# Patient Record
Sex: Male | Born: 1985 | Race: Black or African American | Hispanic: No | Marital: Married | Smoking: Former smoker
Health system: Southern US, Community
[De-identification: ages and names within clinical notes are randomized; demographics above are authoritative.]

## PROBLEM LIST (undated history)

## (undated) DIAGNOSIS — K219 Gastro-esophageal reflux disease without esophagitis: Secondary | ICD-10-CM

## (undated) DIAGNOSIS — F419 Anxiety disorder, unspecified: Secondary | ICD-10-CM

## (undated) DIAGNOSIS — N189 Chronic kidney disease, unspecified: Secondary | ICD-10-CM

## (undated) DIAGNOSIS — I10 Essential (primary) hypertension: Secondary | ICD-10-CM

## (undated) HISTORY — DX: Chronic kidney disease, unspecified: N18.9

## (undated) HISTORY — PX: WISDOM TOOTH EXTRACTION: SHX21

## (undated) HISTORY — DX: Essential (primary) hypertension: I10

## (undated) HISTORY — PX: TONSILLECTOMY AND ADENOIDECTOMY: SUR1326

## (undated) HISTORY — DX: Anxiety disorder, unspecified: F41.9

---
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
## 2011-11-27 ENCOUNTER — Other Ambulatory Visit: Payer: Self-pay | Admitting: Physician Assistant

## 2011-11-27 ENCOUNTER — Ambulatory Visit (INDEPENDENT_AMBULATORY_CARE_PROVIDER_SITE_OTHER): Payer: BC Managed Care – PPO | Admitting: Physician Assistant

## 2011-11-27 ENCOUNTER — Encounter: Payer: Self-pay | Admitting: Physician Assistant

## 2011-11-27 VITALS — BP 129/73 | HR 55 | Ht 75.0 in | Wt 258.0 lb

## 2011-11-27 DIAGNOSIS — F411 Generalized anxiety disorder: Secondary | ICD-10-CM

## 2011-11-27 DIAGNOSIS — F419 Anxiety disorder, unspecified: Secondary | ICD-10-CM

## 2011-11-27 DIAGNOSIS — Z13220 Encounter for screening for lipoid disorders: Secondary | ICD-10-CM

## 2011-11-27 DIAGNOSIS — F329 Major depressive disorder, single episode, unspecified: Secondary | ICD-10-CM

## 2011-11-27 DIAGNOSIS — F3289 Other specified depressive episodes: Secondary | ICD-10-CM

## 2011-11-27 DIAGNOSIS — Z7689 Persons encountering health services in other specified circumstances: Secondary | ICD-10-CM

## 2011-11-27 DIAGNOSIS — Z131 Encounter for screening for diabetes mellitus: Secondary | ICD-10-CM

## 2011-11-27 DIAGNOSIS — R0789 Other chest pain: Secondary | ICD-10-CM

## 2011-11-27 DIAGNOSIS — Z113 Encounter for screening for infections with a predominantly sexual mode of transmission: Secondary | ICD-10-CM

## 2011-11-27 LAB — COMPLETE METABOLIC PANEL WITHOUT GFR
ALT: 40 U/L (ref 0–53)
AST: 27 U/L (ref 0–37)
Albumin: 5.2 g/dL (ref 3.5–5.2)
Alkaline Phosphatase: 49 U/L (ref 39–117)
BUN: 13 mg/dL (ref 6–23)
CO2: 28 meq/L (ref 19–32)
Calcium: 10.3 mg/dL (ref 8.4–10.5)
Chloride: 105 meq/L (ref 96–112)
Creat: 0.9 mg/dL (ref 0.50–1.35)
GFR, Est African American: >89 mL/min
GFR, Est Non African American: >89 mL/min
Glucose, Bld: 94 mg/dL (ref 70–99)
Potassium: 4.8 meq/L (ref 3.5–5.3)
Sodium: 140 meq/L (ref 135–145)
Total Bilirubin: 0.6 mg/dL (ref 0.3–1.2)
Total Protein: 6.9 g/dL (ref 6.0–8.3)

## 2011-11-27 LAB — LIPID PANEL
Cholesterol: 171 mg/dL (ref 0–200)
HDL: 33 mg/dL — ABNORMAL LOW
LDL Cholesterol: 126 mg/dL — ABNORMAL HIGH (ref 0–99)
Total CHOL/HDL Ratio: 5.2 ratio
Triglycerides: 58 mg/dL
VLDL: 12 mg/dL (ref 0–40)

## 2011-11-27 LAB — HIV ANTIBODY (ROUTINE TESTING W REFLEX): HIV: NONREACTIVE

## 2011-11-27 MED ORDER — BUSPIRONE HCL 7.5 MG PO TABS: 7.5000 mg | ORAL_TABLET | Freq: Two times a day (BID) | ORAL | Status: AC

## 2011-11-27 MED ORDER — DEXLANSOPRAZOLE 60 MG PO CPDR: 60.0000 mg | DELAYED_RELEASE_CAPSULE | Freq: Every day | ORAL | Status: DC

## 2011-11-27 NOTE — Patient Instructions (Addendum)
Start Buspar twice a day. Start exercising 4-5 times a week for 30 minutes to and hour. Check out Gardisil shot and see if insurance will pay. Will refer for stress test and psychologist. Will call with labs in next 2-3 days. Start Dexilant daily for acid reflux and consider diet changes. Recheck in 5-8 weeks.  Diet for GERD or PUD Nutrition therapy can help ease the discomfort of gastroesophageal reflux disease (GERD) and peptic ulcer disease (PUD).  HOME CARE INSTRUCTIONS   Eat your meals slowly, in a relaxed setting.   Eat 5 to 6 small meals per day.   If a food causes distress, stop eating it for a period of time.  FOODS TO AVOID  Coffee, regular or decaffeinated.   Cola beverages, regular or low calorie.   Tea, regular or decaffeinated.   Pepper.   Cocoa.   High fat foods, including meats.   Butter, margarine, hydrogenated oil (trans fats).   Peppermint or spearmint (if you have GERD).   Fruits and vegetables if not tolerated.   Alcohol.   Nicotine (smoking or chewing). This is one of the most potent stimulants to acid production in the gastrointestinal tract.   Any food that seems to aggravate your condition.  If you have questions regarding your diet, ask your caregiver or a registered dietitian. TIPS  Lying flat may make symptoms worse. Keep the head of your bed raised 6 to 9 inches (15 to 23 cm) by using a foam wedge or blocks under the legs of the bed.   Do not lay down until 3 hours after eating a meal.   Daily physical activity may help reduce symptoms.  MAKE SURE YOU:   Understand these instructions.   Will watch your condition.   Will get help right away if you are not doing well or get worse.  Document Released: 07/24/2005 Document Revised: 07/13/2011 Document Reviewed: 06/09/2011 [facility] Patient Information 2012 [place], [place].

## 2011-11-27 NOTE — Progress Notes (Signed)
  Subjective:    Patient ID: Gene Nielsen, male    DOB: 11-12-1985, 26 y.o.   MRN: [id number]  HPI Patient presents to the clinic to establish care. PMH reviewed.  Patient wants STD testing today. He has not had the Gardisil vaccine.  He has been having chest pain and tightness. He went to ER last week and was checked out. EKG and all blood work was normal. He has always had a problem with anxiety and depression. He was treated with Prozac before but did not like sexual side effects. He is not currently on any medication. He does not exercise. He denies any bad taste in his mouth or heartburn. He denies any SOB or wheezing.     Review of Systems     Objective:   Physical Exam  Constitutional: He is oriented to person, place, and time. He appears well-developed and well-nourished.  HENT:  Head: Normocephalic and atraumatic.  Cardiovascular: Normal rate, regular rhythm and normal heart sounds.   Pulmonary/Chest: Effort normal and breath sounds normal. He has no wheezes.  Neurological: He is alert and oriented to person, place, and time.  Skin: Skin is warm and dry.  Psychiatric: He has a normal mood and affect. His behavior is normal.          Assessment & Plan:  Anxiety and Depression-GAD-7 was 19. PHQ-9 was 10. Patient did not want to go on SSRI because of sexual side effects and had tried Wellbutrin and didn't work. Started on Buspar 7.5mg  BID. Encouraged regular exercise 4-5 times a week for at least 30 minutes. Patient was told the exercise is the best anti-anxiety/depressant.I think patient would benefit talking to someone about anxiety and situation issues. Will refer to psychologist.  Atypical chest pains-Will refer for stress test and psychologist. Will call with labs in next 2-3 days. Start Dexilant daily for acid reflux and consider diet changes. Recheck in 5-8 weeks.  STD testing wanted by patient.  Vaccine counseling- Patient will check with insurance to see if  they will pay for Gardisil.

## 2011-11-28 NOTE — Progress Notes (Signed)
Quick Note:  Call patient with results. Let them know all labs are within normal limits. ______ 

## 2011-11-29 DIAGNOSIS — F419 Anxiety disorder, unspecified: Secondary | ICD-10-CM | POA: Insufficient documentation

## 2011-11-29 DIAGNOSIS — F32A Depression, unspecified: Secondary | ICD-10-CM | POA: Insufficient documentation

## 2011-11-29 DIAGNOSIS — F329 Major depressive disorder, single episode, unspecified: Secondary | ICD-10-CM | POA: Insufficient documentation

## 2014-07-23 ENCOUNTER — Ambulatory Visit (INDEPENDENT_AMBULATORY_CARE_PROVIDER_SITE_OTHER): Payer: BC Managed Care – PPO | Admitting: Medical

## 2014-07-23 ENCOUNTER — Encounter: Payer: Self-pay | Admitting: Medical

## 2014-07-23 VITALS — BP 142/84 | HR 77 | Temp 98.2°F | Ht 75.0 in | Wt 287.6 lb

## 2014-07-23 DIAGNOSIS — F329 Major depressive disorder, single episode, unspecified: Secondary | ICD-10-CM

## 2014-07-23 DIAGNOSIS — F32A Depression, unspecified: Secondary | ICD-10-CM

## 2014-07-23 DIAGNOSIS — J209 Acute bronchitis, unspecified: Secondary | ICD-10-CM

## 2014-07-23 DIAGNOSIS — I10 Essential (primary) hypertension: Secondary | ICD-10-CM

## 2014-07-23 DIAGNOSIS — F419 Anxiety disorder, unspecified: Secondary | ICD-10-CM

## 2014-07-23 LAB — COMPREHENSIVE METABOLIC PANEL
ALBUMIN: 4.8 g/dL (ref 3.5–5.2)
ALK PHOS: 45 U/L (ref 39–117)
ALT: 50 U/L (ref 0–53)
AST: 32 U/L (ref 0–37)
BUN: 21 mg/dL (ref 6–23)
CO2: 23 mEq/L (ref 19–32)
CREATININE: 1 mg/dL (ref 0.4–1.5)
Calcium: 9.6 mg/dL (ref 8.4–10.5)
Chloride: 109 mEq/L (ref 96–112)
GFR: 112.68 mL/min (ref >60.00)
GLUCOSE: 91 mg/dL (ref 70–99)
POTASSIUM: 4.1 meq/L (ref 3.5–5.1)
Sodium: 142 mEq/L (ref 135–145)
Total Bilirubin: 0.5 mg/dL (ref 0.2–1.2)
Total Protein: 7.3 g/dL (ref 6.0–8.3)

## 2014-07-23 LAB — CBC WITH DIFFERENTIAL/PLATELET
BASOS PCT: 0.5 % (ref 0.0–3.0)
Basophils Absolute: 0 10*3/uL (ref 0.0–0.1)
EOS PCT: 1.7 % (ref 0.0–5.0)
Eosinophils Absolute: 0.1 10*3/uL (ref 0.0–0.7)
HEMATOCRIT: 46.2 % (ref 39.0–52.0)
HEMOGLOBIN: 15.5 g/dL (ref 13.0–17.0)
LYMPHS ABS: 2.6 10*3/uL (ref 0.7–4.0)
Lymphocytes Relative: 43.4 % (ref 12.0–46.0)
MCHC: 33.5 g/dL (ref 30.0–36.0)
MCV: 89.7 fl (ref 78.0–100.0)
Monocytes Absolute: 0.3 10*3/uL (ref 0.1–1.0)
Monocytes Relative: 5.5 % (ref 3.0–12.0)
NEUTROS PCT: 48.9 % (ref 43.0–77.0)
Neutro Abs: 2.9 10*3/uL (ref 1.4–7.7)
Platelets: 248 10*3/uL (ref 150.0–400.0)
RBC: 5.16 Mil/uL (ref 4.22–5.81)
RDW: 13.5 % (ref 11.5–15.5)
WBC: 6 10*3/uL (ref 4.0–10.5)

## 2014-07-23 MED ORDER — LISINOPRIL 5 MG PO TABS: 5.0000 mg | ORAL_TABLET | Freq: Every day | ORAL | Status: DC

## 2014-07-23 MED ORDER — AZITHROMYCIN 250 MG PO TABS: ORAL_TABLET | ORAL | Status: DC

## 2014-07-23 MED ORDER — BENZONATATE 100 MG PO CAPS: 100.0000 mg | ORAL_CAPSULE | Freq: Three times a day (TID) | ORAL | Status: DC | PRN

## 2014-07-23 MED ORDER — FLUTICASONE PROPIONATE 50 MCG/ACT NA SUSP: 2.0000 | Freq: Every day | NASAL | Status: DC

## 2014-07-23 NOTE — Progress Notes (Signed)
Subjective:    Patient ID: Gene Nielsen, male    DOB: 1986-05-07, 28 y.o.   MRN: [id number]  HPI   I have reviewed pt PMH, PSH, FH, Social History and Surgical History Pt works glazier(glass work), works out [employer]weights, no cardio, 1 pepsi a day, married- no children. [employer] grade.  Anxiety- Pt states when younger had but now controlled.   Htn- hx of high bp. Dx 28 yo. Pt states stopped medication 7-8 yr ago. He only has one kidney. He stopped medication 7 yr ago.  Has one kidney- He states one just atrophied. He is not sure which kidney he has.  Pt smokes 7 black and mild a day.  Pt check his bp at home and 140/79 on average. Occasional diastolic of 90. He checks one time a day. No cardiac or neurologic signs or symptoms.  Pt also states chronic cough past 3 weeks. Productive cough and sinus pressure. No fever, no chills. Mild fatigue. Some congestion and rt ear pain.    Past Medical History  Diagnosis Date  . Chronic kidney disease   . Hypertension   . Anxiety     History   Social History  . Marital Status: Single    Spouse Name: N/A    Number of Children: N/A  . Years of Education: N/A   Occupational History  . Not on file.   Social History Main Topics  . Smoking status: Current Every Day Smoker    Last Attempt to Quit: 11/18/2011  . Smokeless tobacco: Not on file  . Alcohol Use: 0.0 oz/week    0 Not specified per week  . Drug Use: Not on file  . Sexual Activity:    Partners: Female   Other Topics Concern  . Not on file   Social History Narrative    Past Surgical History  Procedure Laterality Date  . Tonsillectomy and adenoidectomy    . Wisdom tooth extraction      Family History  Problem Relation Age of Onset  . Depression Mother   . Hypertension Mother     Allergies  Allergen Reactions  . Penicillins Swelling    No current outpatient prescriptions on file prior to visit.   No current facility-administered medications on file prior to  visit.    BP 142/84 mmHg  Pulse 77  Temp(Src) 98.2 F (36.8 C) (Oral)  Ht 6\' 3"  (1.905 m)  Wt 287 lb 9.6 oz (130.455 kg)  BMI 35.95 kg/m2  SpO2 95%           Review of Systems  Constitutional: Positive for fatigue. Negative for fever and chills.  HENT: Positive for sinus pressure. Negative for congestion, postnasal drip and sore throat.        Nasal congestion   Respiratory: Positive for cough. Negative for wheezing.        Mild chest congestion.  Cardiovascular: Negative for chest pain and palpitations.  Musculoskeletal: Negative for back pain and neck pain.  Neurological: Positive for tremors. Negative for dizziness, seizures, syncope, facial asymmetry, speech difficulty, weakness, light-headedness, numbness and headaches.  Hematological: Negative for adenopathy. Does not bruise/bleed easily.       Objective:   Physical Exam   General  Mental Status - Alert. General Appearance - Well groomed. Not in acute distress.  Skin Rashes- No Rashes.  HEENT Head- Normal. Ear Auditory Canal - Left- Normal. Right - Normal.Tympanic Membrane- Left- red Right- Normal. Eye Sclera/Conjunctiva- Left- Normal. Right- Normal. Nose & Sinuses Nasal Mucosa-  Left-  Boggy and Congested. Right-  Boggy and  Congested.Bilateral maxillary and frontal sinus pressure. Mouth & Throat Lips: Upper Lip- Normal: no dryness, cracking, pallor, cyanosis, or vesicular eruption. Lower Lip-Normal: no dryness, cracking, pallor, cyanosis or vesicular eruption. Buccal Mucosa- Bilateral- No Aphthous ulcers. Oropharynx- No Discharge or Erythema. Tonsils: Characteristics- Bilateral- No Erythema or Congestion. Size/Enlargement- Bilateral- No enlargement. Discharge- bilateral-None.  Neck Neck- Supple. No Masses.   Chest and Lung Exam Auscultation: Breath Sounds:-Clear even and unlabored. faint upper lobe rhonchi.  Cardiovascular Auscultation:Rythm- Regular, rate and rhythm. Murmurs & Other Heart  Sounds:Ausculatation of the heart reveal- No Murmurs.  Lymphatic Head & Neck General Head & Neck Lymphatics: Bilateral: Description- No Localized lymphadenopathy.   Neurologic Cranial Nerve exam:- CN III-XII intact(No nystagmus), symmetric smile. Drift Test:- No drift. Romberg Exam:- Negative.  Heal to Toe Gait exam:-Normal. Finger to Nose:- Normal/Intact Strength:- 5/5 equal and symmetric strength both upper and lower extremities.         Assessment & Plan:

## 2014-07-23 NOTE — Assessment & Plan Note (Signed)
For your hypertension I will put you on lisinopril and get labs. With hx of one kidney stressed need for bp control. Pt expressed understanding.

## 2014-07-23 NOTE — Assessment & Plan Note (Signed)
Your appear to have a bronchtis with sinusitis.  I am prescribing azithromycin  antibiotic for the infection. To help with the nasal congestion I prescribed nasal steroid. For your associated cough, I prescribed cough medicine benzonatate

## 2014-07-23 NOTE — Assessment & Plan Note (Signed)
None reported today.

## 2014-07-23 NOTE — Progress Notes (Signed)
Pre visit review using our clinic review tool, if applicable. No additional management support is needed unless otherwise documented below in the visit note. 

## 2014-07-23 NOTE — Patient Instructions (Addendum)
Your appear to have a bronchtis with sinusitis.  I am prescribing azithromycin  antibiotic for the infection. To help with the nasal congestion I prescribed nasal steroid. For your associated cough, I prescribed cough medicine benzonatate   For your hypertension I will put you on lisinopril and get labs today.     Rest, hydrate, tylenol for fever.  Follow up in 7 days or as needed.

## 2014-07-23 NOTE — Assessment & Plan Note (Signed)
Controlled.  

## 2014-07-24 ENCOUNTER — Telehealth: Payer: Self-pay | Admitting: Medical

## 2014-07-24 NOTE — Telephone Encounter (Signed)
emmi mailed  °

## 2014-08-04 ENCOUNTER — Ambulatory Visit: Payer: BC Managed Care – PPO | Admitting: Medical

## 2017-08-21 ENCOUNTER — Ambulatory Visit: Payer: BLUE CROSS/BLUE SHIELD | Admitting: Podiatry

## 2018-03-05 DIAGNOSIS — F330 Major depressive disorder, recurrent, mild: Secondary | ICD-10-CM | POA: Insufficient documentation

## 2018-04-18 DIAGNOSIS — F410 Panic disorder [episodic paroxysmal anxiety] without agoraphobia: Secondary | ICD-10-CM | POA: Insufficient documentation

## 2018-09-04 ENCOUNTER — Ambulatory Visit: Payer: Self-pay | Admitting: Family Medicine

## 2018-09-04 DIAGNOSIS — Z0289 Encounter for other administrative examinations: Secondary | ICD-10-CM

## 2019-01-30 ENCOUNTER — Telehealth (INDEPENDENT_AMBULATORY_CARE_PROVIDER_SITE_OTHER): Payer: Managed Care, Other (non HMO) | Admitting: Family Medicine

## 2019-01-30 ENCOUNTER — Encounter: Payer: Self-pay | Admitting: Family Medicine

## 2019-01-30 DIAGNOSIS — K219 Gastro-esophageal reflux disease without esophagitis: Secondary | ICD-10-CM

## 2019-01-30 DIAGNOSIS — I10 Essential (primary) hypertension: Secondary | ICD-10-CM | POA: Diagnosis not present

## 2019-01-30 HISTORY — DX: Gastro-esophageal reflux disease without esophagitis: K21.9

## 2019-01-30 MED ORDER — PANTOPRAZOLE SODIUM 40 MG PO TBEC: 40.0000 mg | DELAYED_RELEASE_TABLET | Freq: Every day | ORAL | 0 refills | Status: DC

## 2019-01-30 NOTE — Progress Notes (Signed)
Gene Nielsen - 33 y.o. male MRN [id number]  Date of birth: 1985-10-14   This visit type was conducted due to national recommendations for restrictions regarding the COVID-19 Pandemic (e.g. social distancing).  This format is felt to be most appropriate for this patient at this time.  All issues noted in this document were discussed and addressed.  No physical exam was performed (except for noted visual exam findings with Video Visits).  I discussed the limitations of evaluation and management by telemedicine and the availability of in person appointments. The patient expressed understanding and agreed to proceed.  I connected with@ on 01/30/19 at  9:30 AM EDT by a video enabled telemedicine application and verified that I am speaking with the correct person using two identifiers.   Patient Location: [place] [address]   Provider location:   Home office  Chief Complaint  Patient presents with  . Establish Care    Np requesting Labs,- Pre-screened COVID heartburn/indigestion/HTN/Kidneys - One remaining,    HPI  Gene Nielsen is a 33 y.o. male who presents via Engineer, civil (consulting) for a telehealth visit today.  He b reports a history of HTN and solitary kidney.  States that he changed his diet and blood pressure is much better controlled off medication but has started having symptoms of reflux.  Symptoms typically occur in the evenings or early morning.  He reports pain in his stomach and chest.  Feels like tightness at times in his chest.  He denies shortness of breath, worsening with exertion, dark stools, nausea or vomiting.  He is taking pepcid with some improvement.  He denies NSAID use.     ROS:  A comprehensive ROS was completed and negative except as noted per HPI  Past Medical History:  Diagnosis Date  . Anxiety   . Chronic kidney disease   . Hypertension     Past Surgical History:  Procedure Laterality Date  . TONSILLECTOMY AND  ADENOIDECTOMY    . WISDOM TOOTH EXTRACTION      Family History  Problem Relation Age of Onset  . Depression Mother   . Hypertension Mother     Social History   Socioeconomic History  . Marital status: Single    Spouse name: Not on file  . Number of children: Not on file  . Years of education: Not on file  . Highest education level: Not on file  Occupational History  . Not on file  Social Needs  . Financial resource strain: Not on file  . Food insecurity    Worry: Not on file    Inability: Not on file  . Transportation needs    Medical: Not on file    Non-medical: Not on file  Tobacco Use  . Smoking status: Current Every Day Smoker    Last attempt to quit: 11/18/2011    Years since quitting: 7.2  . Smokeless tobacco: Never Used  Substance and Sexual Activity  . Alcohol use: Yes    Alcohol/week: 0.0 standard drinks  . Drug use: Never  . Sexual activity: Yes    Partners: Female  Lifestyle  . Physical activity    Days per week: Not on file    Minutes per session: Not on file  . Stress: Not on file  Relationships  . Social Herbalist on phone: Not on file    Gets together: Not on file    Attends religious service: Not on file    Active member  of club or organization: Not on file    Attends meetings of clubs or organizations: Not on file    Relationship status: Not on file  . Intimate partner violence    Fear of current or ex partner: Not on file    Emotionally abused: Not on file    Physically abused: Not on file    Forced sexual activity: Not on file  Other Topics Concern  . Not on file  Social History Narrative  . Not on file     Current Outpatient Medications:  .  fluticasone (FLONASE) 50 MCG/ACT nasal spray, Place 2 sprays into both nostrils daily., Disp: 16 g, Rfl: 1 .  lisinopril (PRINIVIL,ZESTRIL) 5 MG tablet, Take 1 tablet (5 mg total) by mouth daily., Disp: 90 tablet, Rfl: 3 .  azithromycin (ZITHROMAX) 250 MG tablet, Take 2 tablets by  mouth on day 1, followed by 1 tablet by mouth daily for 4 days., Disp: 6 tablet, Rfl: 0 .  benzonatate (TESSALON) 100 MG capsule, Take 1 capsule (100 mg total) by mouth 3 (three) times daily as needed for cough., Disp: 21 capsule, Rfl: 0 .  pantoprazole (PROTONIX) 40 MG tablet, Take 1 tablet (40 mg total) by mouth daily., Disp: 30 tablet, Rfl: 0  EXAM:  VITALS per patient if applicable: BP 114/75   Pulse (!) 57   Temp (!) 97.3 F (36.3 C) (Oral)   Ht 6\' 3"  (1.905 m)   Wt 243 lb (110.2 kg)   BMI 30.37 kg/m   GENERAL: alert, oriented, appears well and in no acute distress  HEENT: atraumatic, conjunttiva clear, no obvious abnormalities on inspection of external nose and ears  NECK: normal movements of the head and neck  LUNGS: on inspection no signs of respiratory distress, breathing rate appears normal, no obvious gross SOB, gasping or wheezing  CV: no obvious cyanosis  MS: moves all visible extremities without noticeable abnormality  PSYCH/NEURO: pleasant and cooperative, no obvious depression or anxiety, speech and thought processing grossly intact  ASSESSMENT AND PLAN:  Discussed the following assessment and plan:  HTN (hypertension) -BP readings at home are well controlled off medication.  He will f/u in office for BP and lab work in 2 weeks.   GERD (gastroesophageal reflux disease) -2 week trial of protonix.  If not improving with this will check H. Pylori and referral to GI if needed.        I discussed the assessment and treatment plan with the patient. The patient was provided an opportunity to ask questions and all were answered. The patient agreed with the plan and demonstrated an understanding of the instructions.   The patient was advised to call back or seek an in-person evaluation if the symptoms worsen or if the condition fails to improve as anticipated.   Everrett Coombeody Frederico Gerling, DO

## 2019-01-30 NOTE — Assessment & Plan Note (Signed)
-  2 week trial of protonix.  If not improving with this will check H. Pylori and referral to GI if needed.

## 2019-01-30 NOTE — Assessment & Plan Note (Signed)
-  BP readings at home are well controlled off medication.  He will f/u in office for BP and lab work in 2 weeks.

## 2019-02-11 ENCOUNTER — Ambulatory Visit: Payer: Managed Care, Other (non HMO) | Admitting: Family Medicine

## 2019-02-11 ENCOUNTER — Encounter: Payer: Self-pay | Admitting: Family Medicine

## 2019-02-11 NOTE — Telephone Encounter (Signed)
Schedule for visit 7/8

## 2019-02-12 ENCOUNTER — Ambulatory Visit (INDEPENDENT_AMBULATORY_CARE_PROVIDER_SITE_OTHER): Payer: Managed Care, Other (non HMO) | Admitting: Family Medicine

## 2019-02-12 ENCOUNTER — Encounter: Payer: Self-pay | Admitting: Family Medicine

## 2019-02-12 VITALS — BP 130/70 | HR 68 | Temp 98.3°F | Resp 18 | Ht 75.0 in | Wt 252.0 lb

## 2019-02-12 DIAGNOSIS — I10 Essential (primary) hypertension: Secondary | ICD-10-CM | POA: Diagnosis not present

## 2019-02-12 DIAGNOSIS — Z13220 Encounter for screening for lipoid disorders: Secondary | ICD-10-CM | POA: Insufficient documentation

## 2019-02-12 DIAGNOSIS — R0989 Other specified symptoms and signs involving the circulatory and respiratory systems: Secondary | ICD-10-CM | POA: Diagnosis not present

## 2019-02-12 NOTE — Assessment & Plan Note (Signed)
-  Discussed this still may be related to reflux.  Also his woodworking hobbies with exposure to dust and irritants may be contributing as well.  Recommend continued use of protective mask when woodworking as he has noticed some iprovement.  If not resolving he can add on protonix.

## 2019-02-12 NOTE — Assessment & Plan Note (Signed)
-  BP remains well controlled due to lifestyle improvements and weight loss.  Update renal function with history of solitary kidney.

## 2019-02-12 NOTE — Assessment & Plan Note (Signed)
Check lipid panel  

## 2019-02-12 NOTE — Patient Instructions (Signed)
Great to meet you!  Use mask when doing woodwork or using stains/varnishes.  If symptoms persist try the protonix.  If they continue with protonix or worsen let me know.

## 2019-02-12 NOTE — Progress Notes (Signed)
Gene GiovanniZacharaha Allocca - 33 y.o. male MRN [id number]  Date of birth: [birth year]  Subjective Chief Complaint  Patient presents with  . Sore Throat    Onset 3 wks, globlus sensation, wood working 3 wks w/o mask    HPI Gene Nielsen is a 33 y.o. male here today for f/u of GERD and HTN.  He reports that his GERD symptoms have improved but he reports a globus sensation in the back of his throat.  This is more noticeable when trying to swallow.  He denies difficulty swallowing or shortness of breath.   He did not try protonix for GERD and reports symptoms improved on their own.  He has been doing quite a bit of woodworking and exposed to sawdust as well as stains/varnishes.  Purchased a respirator and started using yesterday with woodworking and reports that symptoms have improved today.   His BP has remained well controlled.  History of solitary kidney.  No medication at this time.  Made changes to diet and lost weight to better control.   ROS:  A comprehensive ROS was completed and negative except as noted per HPI  Allergies  Allergen Reactions  . Penicillins Swelling    Past Medical History:  Diagnosis Date  . Anxiety   . Chronic kidney disease   . GERD (gastroesophageal reflux disease) 01/30/2019  . Hypertension     Past Surgical History:  Procedure Laterality Date  . TONSILLECTOMY AND ADENOIDECTOMY    . WISDOM TOOTH EXTRACTION      Social History   Socioeconomic History  . Marital status: Single    Spouse name: Not on file  . Number of children: Not on file  . Years of education: Not on file  . Highest education level: Not on file  Occupational History  . Not on file  Social Needs  . Financial resource strain: Not on file  . Food insecurity    Worry: Not on file    Inability: Not on file  . Transportation needs    Medical: Not on file    Non-medical: Not on file  Tobacco Use  . Smoking status: Current Every Day Smoker    Last attempt to quit: 11/18/2011    Years  since quitting: 7.2  . Smokeless tobacco: Never Used  Substance and Sexual Activity  . Alcohol use: Yes    Alcohol/week: 0.0 standard drinks  . Drug use: Never  . Sexual activity: Yes    Partners: Female  Lifestyle  . Physical activity    Days per week: Not on file    Minutes per session: Not on file  . Stress: Not on file  Relationships  . Social Musicianconnections    Talks on phone: Not on file    Gets together: Not on file    Attends religious service: Not on file    Active member of club or organization: Not on file    Attends meetings of clubs or organizations: Not on file    Relationship status: Not on file  Other Topics Concern  . Not on file  Social History Narrative  . Not on file    Family History  Problem Relation Age of Onset  . Depression Mother   . Hypertension Mother     Health Maintenance  Topic Date Due  . INFLUENZA VACCINE  03/08/2019  . TETANUS/TDAP  11/26/2021  . HIV Screening  Completed    ----------------------------------------------------------------------------------------------------------------------------------------------------------------------------------------------------------------- Physical Exam BP 130/70   Pulse 68   Temp 98.3 F (36.8  C) (Oral)   Resp 18   Ht 6\' 3"  (1.905 m)   Wt 252 lb (114.3 kg)   SpO2 96%   BMI 31.50 kg/m   Physical Exam Constitutional:      Appearance: Normal appearance.  HENT:     Head: Normocephalic.     Nose: Nose normal.     Mouth/Throat:     Mouth: Mucous membranes are moist.     Pharynx: No posterior oropharyngeal erythema.  Eyes:     General: No scleral icterus. Neck:     Musculoskeletal: Normal range of motion and neck supple. No muscular tenderness.  Cardiovascular:     Rate and Rhythm: Normal rate and regular rhythm.  Pulmonary:     Effort: Pulmonary effort is normal.     Breath sounds: Normal breath sounds.  Lymphadenopathy:     Cervical: No cervical adenopathy.  Skin:    General:  Skin is warm and dry.  Neurological:     General: No focal deficit present.     Mental Status: He is alert.  Psychiatric:        Mood and Affect: Mood normal.        Behavior: Behavior normal.     ------------------------------------------------------------------------------------------------------------------------------------------------------------------------------------------------------------------- Assessment and Plan  HTN (hypertension) -BP remains well controlled due to lifestyle improvements and weight loss.  Update renal function with history of solitary kidney.   Globus sensation -Discussed this still may be related to reflux.  Also his woodworking hobbies with exposure to dust and irritants may be contributing as well.  Recommend continued use of protective mask when woodworking as he has noticed some iprovement.  If not resolving he can add on protonix.  Screening for lipid disorders Check lipid panel.

## 2019-02-13 LAB — CBC
HCT: 42.1 % (ref 38.5–50.0)
Hemoglobin: 14.5 g/dL (ref 13.2–17.1)
MCH: 30.7 pg (ref 27.0–33.0)
MCHC: 34.4 g/dL (ref 32.0–36.0)
MCV: 89 fL (ref 80.0–100.0)
MPV: 12.7 fL — ABNORMAL HIGH (ref 7.5–12.5)
Platelets: 230 10*3/uL (ref 140–400)
RBC: 4.73 10*6/uL (ref 4.20–5.80)
RDW: 12.7 % (ref 11.0–15.0)
WBC: 5 10*3/uL (ref 3.8–10.8)

## 2019-02-13 LAB — BASIC METABOLIC PANEL
BUN: 17 mg/dL (ref 7–25)
CO2: 25 mmol/L (ref 20–32)
Calcium: 9.5 mg/dL (ref 8.6–10.3)
Chloride: 106 mmol/L (ref 98–110)
Creat: 0.94 mg/dL (ref 0.60–1.35)
Glucose, Bld: 103 mg/dL — ABNORMAL HIGH (ref 65–99)
Potassium: 4.2 mmol/L (ref 3.5–5.3)
Sodium: 140 mmol/L (ref 135–146)

## 2019-02-13 LAB — TSH: TSH: 1.17 mIU/L (ref 0.40–4.50)

## 2019-02-13 LAB — LIPID PANEL
Cholesterol: 169 mg/dL (ref <200)
HDL: 38 mg/dL — ABNORMAL LOW (ref >=40)
LDL Cholesterol (Calc): 97 mg/dL (calc)
Non-HDL Cholesterol (Calc): 131 mg/dL (calc) — ABNORMAL HIGH (ref <130)
Total CHOL/HDL Ratio: 4.4 (calc) (ref <5.0)
Triglycerides: 222 mg/dL — ABNORMAL HIGH (ref <150)

## 2019-03-05 ENCOUNTER — Encounter: Payer: Self-pay | Admitting: Family Medicine

## 2019-03-16 ENCOUNTER — Encounter: Payer: Self-pay | Admitting: Family Medicine

## 2019-05-01 ENCOUNTER — Encounter: Payer: Self-pay | Admitting: Family Medicine

## 2019-05-01 ENCOUNTER — Other Ambulatory Visit: Payer: Self-pay | Admitting: Family Medicine

## 2019-05-01 DIAGNOSIS — R1310 Dysphagia, unspecified: Secondary | ICD-10-CM

## 2019-05-01 DIAGNOSIS — R0989 Other specified symptoms and signs involving the circulatory and respiratory systems: Secondary | ICD-10-CM

## 2019-05-01 DIAGNOSIS — K219 Gastro-esophageal reflux disease without esophagitis: Secondary | ICD-10-CM

## 2019-05-02 ENCOUNTER — Encounter: Payer: Self-pay | Admitting: Gastroenterology

## 2019-05-08 ENCOUNTER — Ambulatory Visit: Payer: Managed Care, Other (non HMO) | Admitting: Gastroenterology

## 2019-05-26 ENCOUNTER — Ambulatory Visit: Payer: Managed Care, Other (non HMO) | Admitting: Gastroenterology

## 2019-06-02 ENCOUNTER — Encounter: Payer: Self-pay | Admitting: Gastroenterology

## 2019-06-10 ENCOUNTER — Ambulatory Visit: Payer: Managed Care, Other (non HMO) | Admitting: Gastroenterology

## 2019-06-30 ENCOUNTER — Ambulatory Visit: Payer: Managed Care, Other (non HMO) | Admitting: Gastroenterology

## 2019-07-07 ENCOUNTER — Encounter: Payer: Self-pay | Admitting: Family Medicine

## 2019-09-29 ENCOUNTER — Encounter: Payer: Self-pay | Admitting: Family Medicine

## 2019-09-29 ENCOUNTER — Encounter: Payer: Self-pay | Admitting: Nurse Practitioner

## 2019-09-30 ENCOUNTER — Other Ambulatory Visit: Payer: Self-pay

## 2019-09-30 ENCOUNTER — Encounter: Payer: Self-pay | Admitting: Nurse Practitioner

## 2019-09-30 ENCOUNTER — Ambulatory Visit: Payer: Managed Care, Other (non HMO) | Admitting: Nurse Practitioner

## 2019-09-30 ENCOUNTER — Encounter: Payer: Self-pay | Admitting: Gastroenterology

## 2019-09-30 VITALS — BP 124/68 | HR 57 | Temp 97.3°F | Ht 75.0 in | Wt 260.1 lb

## 2019-09-30 DIAGNOSIS — Z01818 Encounter for other preprocedural examination: Secondary | ICD-10-CM | POA: Diagnosis not present

## 2019-09-30 DIAGNOSIS — K219 Gastro-esophageal reflux disease without esophagitis: Secondary | ICD-10-CM

## 2019-09-30 DIAGNOSIS — R1013 Epigastric pain: Secondary | ICD-10-CM | POA: Insufficient documentation

## 2019-09-30 DIAGNOSIS — R140 Abdominal distension (gaseous): Secondary | ICD-10-CM

## 2019-09-30 NOTE — Progress Notes (Signed)
09/30/2019 Pasha Broad [id number] 02-16-1986   CHIEF COMPLAINT: upper abdominal pain and indigestion  HISTORY OF PRESENT ILLNESS:  Gene Nielsen is a 34 year old male past medical history of hypertension and kidney disease of unclear etiology ( possibly from HTN).  He reports one of his kidneys is much smaller than the other kidney. He denies ever having any kidney surgery.  He reports  having normal kidney function.  He presents today accompanied by his wife for further evaluation regarding indigestion and upper abdominal pain.  He reports having heartburn 2 to 3 days weekly since July 2020.  He was seen by his PCP in July and he was prescribed Protonix which he never took.  He started taking Pepcid 20 mg 1 tab 2 to 3 days weekly with significant relief so he did not start the Protonix.  Two nights ago, he ate a large bowl of cereal with milk while sitting in bed.  Shortly after, he developed heart racing, his feet were sweating with upper abdominal bloat which developed into a sharp gastric pain than a lingering dull pain.  No nausea or vomiting.  The next morning, he ate eggs, bacon and a biscuit and his upper abdominal bloat and pain recurred without palpitations or sweating to his feet.  He denies having any dysphagia.  He describes feeling as if saliva backing up into his esophagus.  No NSAID use.  He has gained 20 pounds over the past 3 months.  His weight today is 260 pounds.  Has been exercising more with focus on his core muscles the past few weeks with associated muscle soreness.  He is passing a normal formed brown bowel movement once daily.  No rectal bleeding or black stools.  No family history of esophageal, gastric or colorectal cancer.  Past Medical History:  Diagnosis Date  . Anxiety   . Chronic kidney disease    Only have one kidney   . GERD (gastroesophageal reflux disease) 01/30/2019  . Hypertension    Past Surgical History:  Procedure Laterality Date  .  TONSILLECTOMY AND ADENOIDECTOMY    . WISDOM TOOTH EXTRACTION      Family History: Mother 28 with HTN.  Father died from a stroke age 4's. Sister x 2 healthy.   Social History: Married. He works Statistician. Quit smoking 5 years ago, smoked 10 years. He drinks 1 beer daily or less. Marijuana in the past, none for 2 years.   Medications: Famotidine 20mg  1 tab PRN.   Allergies: Penicillins. Reaction: Swelling.    REVIEW OF SYSTEMS: All other systems reviewed and negative except where noted in the History of Present Illness.   PHYSICAL EXAM: Temp (!) 97.3 F (36.3 C)   Ht 6\' 3"  (1.905 m)   Wt 260 lb 2 oz (118 kg)   BMI 32.51 kg/m  General: Well developed  34 year old male in no acute distress. Head: Normocephalic and atraumatic. Eyes:  Sclerae non-icteric, conjunctive pink. Ears: Normal auditory acuity. Mouth: Dentition intact. No ulcers or lesions.  Neck: Supple, no lymphadenopathy or thyromegaly.  Lungs: Clear bilaterally to auscultation without wheezes, crackles or rhonchi. Heart: Regular rate and rhythm. No murmur, rub or gallop appreciated.  Abdomen: Soft, nontender, non distended. No masses. No hepatosplenomegaly. Normoactive bowel sounds x 4 quadrants.  Rectal: Deferred.  Musculoskeletal: Symmetrical with no gross deformities. Skin: Warm and dry. No rash or lesions on visible extremities. Extremities: No edema. Neurological: Alert oriented x 4, no focal deficits.  Psychological:  Alert and cooperative. Normal mood and affect.  ASSESSMENT AND PLAN:  9. 34 year old male with GERD, epigastric pain and upper abdominal bloat  -EGD benefits and risks discussed including risk with sedation, risk of bleeding, perforation and infection  -CBC, CMP, CRP, Lipase -Famotidine 20mg  one tab po once daily, ok to take bid if needed -Abdominal sonogram -Phillip's bacteria probiotic once daily  -GERD handout  -No dairy products for 2 weeks  -Patient to call our office if his  symptoms worsen   2. HTN, currently well controlled  -Reduce weight recommended          CC:  , DO

## 2019-09-30 NOTE — Patient Instructions (Addendum)
If you are age 33 or older, your body mass index should be between 23-30. Your Body mass index is 32.51 kg/m. If this is out of the aforementioned range listed, please consider follow up with your Primary Care Provider.  If you are age 55 or younger, your body mass index should be between 19-25. Your Body mass index is 32.51 kg/m. If this is out of the aformentioned range listed, please consider follow up with your Primary Care Provider.   You have been scheduled for an abdominal ultrasound at [facility] (1st floor of hospital) on 10/03/2019 at 8:30. Please arrive 15 minutes prior to your appointment for registration. Make certain not to have anything to eat or drink 8 hours prior to your appointment. Should you need to reschedule your appointment, please contact radiology at [phone]. This test typically takes about 30 minutes to perform.  Your provider has requested that you have lab work today. We ask that you go to our [facility] office at [address] [place], [place] [address]. Please enter through the main entrance and go to the elevator.  Press "B" on the elevator. The lab is located at the first door on the left as you exit the elevator.  Please start using the over the counter medications: 1. Pepcid 20 mg 1 tablet by mouth daily, you may take 1 tablet twice a day as needed. 2. Vear Clock bacteria probiotic 1 by mouth daily.  No dairy products for 2 weeks. Gastroesophageal Reflux Disease, Adult Gastroesophageal reflux (GER) happens when acid from the stomach flows up into the tube that connects the mouth and the stomach (esophagus). Normally, food travels down the esophagus and stays in the stomach to be digested. With GER, food and stomach acid sometimes move back up into the esophagus. You may have a disease called gastroesophageal reflux disease (GERD) if the reflux:  Happens often.  Causes frequent or very bad symptoms.  Causes problems such as damage to the  esophagus. When this happens, the esophagus becomes sore and swollen (inflamed). Over time, GERD can make small holes (ulcers) in the lining of the esophagus. What are the causes? This condition is caused by a problem with the muscle between the esophagus and the stomach. When this muscle is weak or not normal, it does not close properly to keep food and acid from coming back up from the stomach. The muscle can be weak because of:  Tobacco use.  Pregnancy.  Having a certain type of hernia (hiatal hernia).  Alcohol use.  Certain foods and drinks, such as coffee, chocolate, onions, and peppermint. What increases the risk? You are more likely to develop this condition if you:  Are overweight.  Have a disease that affects your connective tissue.  Use NSAID medicines. What are the signs or symptoms? Symptoms of this condition include:  Heartburn.  Difficult or painful swallowing.  The feeling of having a lump in the throat.  A bitter taste in the mouth.  Bad breath.  Having a lot of saliva.  Having an upset or bloated stomach.  Belching.  Chest pain. Different conditions can cause chest pain. Make sure you see your doctor if you have chest pain.  Shortness of breath or noisy breathing (wheezing).  Ongoing (chronic) cough or a cough at night.  Wearing away of the surface of teeth (tooth enamel).  Weight loss. How is this treated? Treatment will depend on how bad your symptoms are. Your doctor may suggest:  Changes to your diet.  Medicine.  Surgery. Follow these instructions at home: Eating and drinking   Follow a diet as told by your doctor. You may need to avoid foods and drinks such as: ? Coffee and tea (with or without caffeine). ? Drinks that contain alcohol. ? Energy drinks and sports drinks. ? Bubbly (carbonated) drinks or sodas. ? Chocolate and cocoa. ? Peppermint and mint flavorings. ? Garlic and onions. ? Horseradish. ? Spicy and acidic foods.  These include peppers, chili powder, curry powder, vinegar, hot sauces, and BBQ sauce. ? Citrus fruit juices and citrus fruits, such as oranges, lemons, and limes. ? Tomato-based foods. These include red sauce, chili, salsa, and pizza with red sauce. ? Fried and fatty foods. These include donuts, french fries, potato chips, and high-fat dressings. ? High-fat meats. These include hot dogs, rib eye steak, sausage, ham, and bacon. ? High-fat dairy items, such as whole milk, butter, and cream cheese.  Eat small meals often. Avoid eating large meals.  Avoid drinking large amounts of liquid with your meals.  Avoid eating meals during the 2-3 hours before bedtime.  Avoid lying down right after you eat.  Do not exercise right after you eat. Lifestyle   Do not use any products that contain nicotine or tobacco. These include cigarettes, e-cigarettes, and chewing tobacco. If you need help quitting, ask your doctor.  Try to lower your stress. If you need help doing this, ask your doctor.  If you are overweight, lose an amount of weight that is healthy for you. Ask your doctor about a safe weight loss goal. General instructions  Pay attention to any changes in your symptoms.  Take over-the-counter and prescription medicines only as told by your doctor. Do not take aspirin, ibuprofen, or other NSAIDs unless your doctor says it is okay.  Wear loose clothes. Do not wear anything tight around your waist.  Raise (elevate) the head of your bed about 6 inches (15 cm).  Avoid bending over if this makes your symptoms worse.  Keep all follow-up visits as told by your doctor. This is important. Contact a doctor if:  You have new symptoms.  You lose weight and you do not know why.  You have trouble swallowing or it hurts to swallow.  You have wheezing or a cough that keeps happening.  Your symptoms do not get better with treatment.  You have a hoarse voice. Get help right away if:  You  have pain in your arms, neck, jaw, teeth, or back.  You feel sweaty, dizzy, or light-headed.  You have chest pain or shortness of breath.  You throw up (vomit) and your throw-up looks like blood or coffee grounds.  You pass out (faint).  Your poop (stool) is bloody or black.  You cannot swallow, drink, or eat. Summary  If a person has gastroesophageal reflux disease (GERD), food and stomach acid move back up into the esophagus and cause symptoms or problems such as damage to the esophagus.  Treatment will depend on how bad your symptoms are.  Follow a diet as told by your doctor.  Take all medicines only as told by your doctor. This information is not intended to replace advice given to you by your health care provider. Make sure you discuss any questions you have with your health care provider. Document Revised: 01/30/2018 Document Reviewed: 01/30/2018 Elsevier Patient Education  2020 [place] for Gastroesophageal Reflux Disease, Adult When you have gastroesophageal reflux disease (GERD), the foods you eat and  your eating habits are very important. Choosing the right foods can help ease your discomfort. Think about working with a nutrition specialist (dietitian) to help you make good choices. What are tips for following this plan?  Meals  Choose healthy foods that are low in fat, such as fruits, vegetables, whole grains, low-fat dairy products, and lean meat, fish, and poultry.  Eat small meals often instead of 3 large meals a day. Eat your meals slowly, and in a place where you are relaxed. Avoid bending over or lying down until 2-3 hours after eating.  Avoid eating meals 2-3 hours before bed.  Avoid drinking a lot of liquid with meals.  Cook foods using methods other than frying. Bake, grill, or broil food instead.  Avoid or limit: ? Chocolate. ? Peppermint or spearmint. ? Alcohol. ? Pepper. ? Black and decaffeinated coffee. ? Black and  decaffeinated tea. ? Bubbly (carbonated) soft drinks. ? Caffeinated energy drinks and soft drinks.  Limit high-fat foods such as: ? Fatty meat or fried foods. ? Whole milk, cream, butter, or ice cream. ? Nuts and nut butters. ? Pastries, donuts, and sweets made with butter or shortening.  Avoid foods that cause symptoms. These foods may be different for everyone. Common foods that cause symptoms include: ? Tomatoes. ? Oranges, lemons, and limes. ? Peppers. ? Spicy food. ? Onions and garlic. ? Vinegar. Lifestyle  Maintain a healthy weight. Ask your doctor what weight is healthy for you. If you need to lose weight, work with your doctor to do so safely.  Exercise for at least 30 minutes for 5 or more days each week, or as told by your doctor.  Wear loose-fitting clothes.  Do not smoke. If you need help quitting, ask your doctor.  Sleep with the head of your bed higher than your feet. Use a wedge under the mattress or blocks under the bed frame to raise the head of the bed. Summary  When you have gastroesophageal reflux disease (GERD), food and lifestyle choices are very important in easing your symptoms.  Eat small meals often instead of 3 large meals a day. Eat your meals slowly, and in a place where you are relaxed.  Limit high-fat foods such as fatty meat or fried foods.  Avoid bending over or lying down until 2-3 hours after eating.  Avoid peppermint and spearmint, caffeine, alcohol, and chocolate. This information is not intended to replace advice given to you by your health care provider. Make sure you discuss any questions you have with your health care provider. Document Revised: 11/14/2018 Document Reviewed: 08/29/2016 Elsevier Patient Education  [employer].  Due to recent changes in healthcare laws, you may see the results of your imaging and laboratory studies on MyChart before your provider has had a chance to review them.  We understand that in some cases  there may be results that are confusing or concerning to you. Not all laboratory results come back in the same time frame and the provider may be waiting for multiple results in order to interpret others.  Please give [place] 48 hours in order for your provider to thoroughly review all the results before contacting the office for clarification of your results.

## 2019-10-01 ENCOUNTER — Telehealth: Payer: Self-pay | Admitting: Nurse Practitioner

## 2019-10-01 NOTE — Telephone Encounter (Signed)
Please contact the patient and let him know Dr. Barron Alvine recommended he try a fodmap diet which may reduce his GI symptoms. Please mail the patient a FODMAP handout. thx

## 2019-10-01 NOTE — Progress Notes (Signed)
Agree with the assessment and plan as outlined by Alcide Evener, NP.  Plan for evaluation with labs, imaging, EGD with biopsies as outlined, along with medical management.  May also benefit from trial of low FODMAP diet.  Doristine Locks, DO, [facility]

## 2019-10-03 ENCOUNTER — Ambulatory Visit (HOSPITAL_COMMUNITY): Payer: Managed Care, Other (non HMO)

## 2019-10-03 NOTE — Patient Instructions (Signed)
Low-FODMAP Eating Plan  FODMAPs (fermentable oligosaccharides, disaccharides, monosaccharides, and polyols) are sugars that are hard for some people to digest. A low-FODMAP eating plan may help some people who have bowel (intestinal) diseases to manage their symptoms. This meal plan can be complicated to follow. Work with a diet and nutrition specialist (dietitian) to make a low-FODMAP eating plan that is right for you. A dietitian can make sure that you get enough nutrition from this diet. What are tips for following this plan? Reading food labels  Check labels for hidden FODMAPs such as: ? High-fructose syrup. ? Honey. ? Agave. ? Natural fruit flavors. ? Onion or garlic powder.  Choose low-FODMAP foods that contain 3-4 grams of fiber per serving.  Check food labels for serving sizes. Eat only one serving at a time to make sure FODMAP levels stay low. Meal planning  Follow a low-FODMAP eating plan for up to 6 weeks, or as told by your health care provider or dietitian.  To follow the eating plan: 1. Eliminate high-FODMAP foods from your diet completely. 2. Gradually reintroduce high-FODMAP foods into your diet one at a time. Most people should wait a few days after introducing one high-FODMAP food before they introduce the next high-FODMAP food. Your dietitian can recommend how quickly you may reintroduce foods. 3. Keep a daily record of what you eat and drink, and make note of any symptoms that you have after eating. 4. Review your daily record with a dietitian regularly. Your dietitian can help you identify which foods you can eat and which foods you should avoid. General tips  Drink enough fluid each day to keep your urine pale yellow.  Avoid processed foods. These often have added sugar and may be high in FODMAPs.  Avoid most dairy products, whole grains, and sweeteners.  Work with a dietitian to make sure you get enough fiber in your diet. Recommended  foods Grains  Gluten-free grains, such as rice, oats, buckwheat, quinoa, corn, polenta, and millet. Gluten-free pasta, bread, or cereal. Rice noodles. Corn tortillas. Vegetables  Eggplant, zucchini, cucumber, peppers, green beans, Brussels sprouts, bean sprouts, lettuce, arugula, kale, Swiss chard, spinach, collard greens, bok choy, summer squash, potato, and tomato. Limited amounts of corn, carrot, and sweet potato. Green parts of scallions. Fruits  Bananas, oranges, lemons, limes, blueberries, raspberries, strawberries, grapes, cantaloupe, honeydew melon, kiwi, papaya, passion fruit, and pineapple. Limited amounts of dried cranberries, banana chips, and shredded coconut. Dairy  Lactose-free milk, yogurt, and kefir. Lactose-free cottage cheese and ice cream. Non-dairy milks, such as almond, coconut, hemp, and rice milk. Yogurts made of non-dairy milks. Limited amounts of goat cheese, brie, mozzarella, parmesan, swiss, and other hard cheeses. Meats and other protein foods  Unseasoned beef, pork, poultry, or fish. Eggs. Bacon. Tofu (firm) and tempeh. Limited amounts of nuts and seeds, such as almonds, walnuts, brazil nuts, pecans, peanuts, pumpkin seeds, chia seeds, and sunflower seeds. Fats and oils  Butter-free spreads. Vegetable oils, such as olive, canola, and sunflower oil. Seasoning and other foods  Artificial sweeteners with names that do not end in "ol" such as aspartame, saccharine, and stevia. Maple syrup, white table sugar, raw sugar, brown sugar, and molasses. Fresh basil, coriander, parsley, rosemary, and thyme. Beverages  Water and mineral water. Sugar-sweetened soft drinks. Small amounts of orange juice or cranberry juice. Black and green tea. Most dry wines. Coffee. This may not be a complete list of low-FODMAP foods. Talk with your dietitian for more information. Foods to avoid Grains  Wheat,   including kamut, durum, and semolina. Barley and bulgur. Couscous. Wheat-based  cereals. Wheat noodles, bread, crackers, and pastries. Vegetables  Chicory root, artichoke, asparagus, cabbage, snow peas, sugar snap peas, mushrooms, and cauliflower. Onions, garlic, leeks, and the white part of scallions. Fruits  Fresh, dried, and juiced forms of apple, pear, watermelon, peach, plum, cherries, apricots, blackberries, boysenberries, figs, nectarines, and mango. Avocado. Dairy  Milk, yogurt, ice cream, and soft cheese. Cream and sour cream. Milk-based sauces. Custard. Meats and other protein foods  Fried or fatty meat. Sausage. Cashews and pistachios. Soybeans, baked beans, black beans, chickpeas, kidney beans, fava beans, navy beans, lentils, and split peas. Seasoning and other foods  Any sugar-free gum or candy. Foods that contain artificial sweeteners such as sorbitol, mannitol, isomalt, or xylitol. Foods that contain honey, high-fructose corn syrup, or agave. Bouillon, vegetable stock, beef stock, and chicken stock. Garlic and onion powder. Condiments made with onion, such as hummus, chutney, pickles, relish, salad dressing, and salsa. Tomato paste. Beverages  Chicory-based drinks. Coffee substitutes. Chamomile tea. Fennel tea. Sweet or fortified wines such as port or sherry. Diet soft drinks made with isomalt, mannitol, maltitol, sorbitol, or xylitol. Apple, pear, and mango juice. Juices with high-fructose corn syrup. This may not be a complete list of high-FODMAP foods. Talk with your dietitian to discuss what dietary choices are best for you.  Summary  A low-FODMAP eating plan is a short-term diet that eliminates FODMAPs from your diet to help ease symptoms of certain bowel diseases.  The eating plan usually lasts up to 6 weeks. After that, high-FODMAP foods are restarted gradually, one at a time, so you can find out which may be causing symptoms.  A low-FODMAP eating plan can be complicated. It is best to work with a dietitian who has experience with this type of  plan. This information is not intended to replace advice given to you by your health care provider. Make sure you discuss any questions you have with your health care provider. Document Revised: 07/06/2017 Document Reviewed: 03/20/2017 Elsevier Patient Education  2020 [employer] Inc[employer]  

## 2019-10-03 NOTE — Telephone Encounter (Signed)
Called and spoke with patient's wife-Brittney-verified DPR-Brittney given instructions and verbalized understanding of information/instructions; Brittney advised to call back to the office at [phone] should questions/concerns arise;  FODMAP sent to patient via MyChart;

## 2019-10-06 ENCOUNTER — Other Ambulatory Visit: Payer: Self-pay

## 2019-10-06 ENCOUNTER — Other Ambulatory Visit (INDEPENDENT_AMBULATORY_CARE_PROVIDER_SITE_OTHER): Payer: Managed Care, Other (non HMO)

## 2019-10-06 ENCOUNTER — Ambulatory Visit (HOSPITAL_COMMUNITY)
Admission: RE | Admit: 2019-10-06 | Discharge: 2019-10-06 | Disposition: A | Discharge status: Home / Self Care | Payer: Managed Care, Other (non HMO) | Source: Ambulatory Visit | Attending: Nurse Practitioner | Admitting: Nurse Practitioner

## 2019-10-06 DIAGNOSIS — R140 Abdominal distension (gaseous): Secondary | ICD-10-CM

## 2019-10-06 DIAGNOSIS — K219 Gastro-esophageal reflux disease without esophagitis: Secondary | ICD-10-CM | POA: Diagnosis present

## 2019-10-06 DIAGNOSIS — R1013 Epigastric pain: Secondary | ICD-10-CM

## 2019-10-06 LAB — LIPASE: Lipase: 18 U/L (ref 11.0–59.0)

## 2019-10-06 LAB — COMPREHENSIVE METABOLIC PANEL
ALT: 20 U/L (ref 0–53)
AST: 20 U/L (ref 0–37)
Albumin: 4.8 g/dL (ref 3.5–5.2)
Alkaline Phosphatase: 43 U/L (ref 39–117)
BUN: 17 mg/dL (ref 6–23)
CO2: 28 mEq/L (ref 19–32)
Calcium: 9.7 mg/dL (ref 8.4–10.5)
Chloride: 106 mEq/L (ref 96–112)
Creatinine, Ser: 0.98 mg/dL (ref 0.40–1.50)
GFR: 107.12 mL/min (ref >60.00)
Glucose, Bld: 92 mg/dL (ref 70–99)
Potassium: 4.5 mEq/L (ref 3.5–5.1)
Sodium: 142 mEq/L (ref 135–145)
Total Bilirubin: 0.5 mg/dL (ref 0.2–1.2)
Total Protein: 7.5 g/dL (ref 6.0–8.3)

## 2019-10-06 LAB — CBC WITH DIFFERENTIAL/PLATELET
Basophils Absolute: 0 10*3/uL (ref 0.0–0.1)
Basophils Relative: 0.6 % (ref 0.0–3.0)
Eosinophils Absolute: 0.1 10*3/uL (ref 0.0–0.7)
Eosinophils Relative: 2.6 % (ref 0.0–5.0)
HCT: 43.1 % (ref 39.0–52.0)
Hemoglobin: 15 g/dL (ref 13.0–17.0)
Lymphocytes Relative: 48.1 % — ABNORMAL HIGH (ref 12.0–46.0)
Lymphs Abs: 1.8 10*3/uL (ref 0.7–4.0)
MCHC: 34.7 g/dL (ref 30.0–36.0)
MCV: 89.9 fl (ref 78.0–100.0)
Monocytes Absolute: 0.2 10*3/uL (ref 0.1–1.0)
Monocytes Relative: 5.5 % (ref 3.0–12.0)
Neutro Abs: 1.6 10*3/uL (ref 1.4–7.7)
Neutrophils Relative %: 43.2 % (ref 43.0–77.0)
Platelets: 224 10*3/uL (ref 150.0–400.0)
RBC: 4.8 Mil/uL (ref 4.22–5.81)
RDW: 13.6 % (ref 11.5–15.5)
WBC: 3.7 10*3/uL — ABNORMAL LOW (ref 4.0–10.5)

## 2019-10-06 LAB — C-REACTIVE PROTEIN: CRP: <1 mg/dL (ref 0.5–20.0)

## 2019-10-07 ENCOUNTER — Telehealth: Payer: Self-pay | Admitting: Nurse Practitioner

## 2019-10-07 DIAGNOSIS — K769 Liver disease, unspecified: Secondary | ICD-10-CM

## 2019-10-07 DIAGNOSIS — K838 Other specified diseases of biliary tract: Secondary | ICD-10-CM

## 2019-10-07 DIAGNOSIS — K824 Cholesterolosis of gallbladder: Secondary | ICD-10-CM

## 2019-10-07 NOTE — Telephone Encounter (Signed)
Dr. Bryan Lemma, pls review abdominal sonogram results on pt. I spoke with the patient's wife regarding results. I recommended an abdominal MRI and MRCP. She wanted to know if there was a less expensive image study to assess the gallbladder polyp, dilated CBD, liver lesion? He cancelled EGD which was scheduled later this week as he would like to complete the liver/gallbaldder evaluation first.  Please let me know your recommendations. Refer to office consult 2/23. LFTs/alk phos normal. Currently, he is feeling better.   Abdominal sono 3/1:  1. Small gallbladder polyp. Negative for shadowing stone or acute gallbladder disease. 2. Borderline dilatation of the common bile duct. Correlate with LFTs. If obstruction is suspected, further evaluation with MRCP could be obtained 3. Slightly echogenic liver suggesting steatosis. Focal 1.8 cm echogenic liver mass, question hemangioma. MRI could be obtained for further evaluation. 4. Nonvisualized left kidney. This is either due to significant atrophy or congenital absence.

## 2019-10-07 NOTE — Telephone Encounter (Signed)
Appts cancelled-does that patient know about these results? Please advise

## 2019-10-07 NOTE — Telephone Encounter (Signed)
Bre, pls cancel patient's Covid test 3/2 and EGD 3/4 as his abd sono is abnormal and he will need an abd MRI and MRCP. I will contact you later today to schedule the MRI/MRCP once communicate with Dr. Barron Alvine. thx

## 2019-10-07 NOTE — Telephone Encounter (Signed)
Yes, I spoke to his wife as her # was the only provided number. I explained all of the finding in the abd sono in full detail with his wife. I will keep you posted on plan after I hear  back from Dr. Barron Alvine. Thx

## 2019-10-08 NOTE — Telephone Encounter (Signed)
Ultrasound results reviewed.  Normal AST/ALT/T bili/ALP.  No serologic evidence of obstruction, but did make note of borderline dilated CBD (although no measurement given).  I do agree that dedicated liver imaging is indicated for incidental finding of lesions greater than 1 cm. I agree that MRI/MRCP would give [place] the most information to work-up both the borderline dilated duct and the 1.8 cm lesion in the liver.  Alternatively, could do a CT three-phase liver which would allow good visualization and characterization of being 1.8 cm liver mass and should provide good detail of the CBD.  Given the normal ALP/T bili, low suspicion for choledocholithiasis or other obstruction, so this would be a reasonable alternative (CT three-phase).

## 2019-10-08 NOTE — Telephone Encounter (Signed)
Patient has been scheduled for abd MRI with MRCP on 10/11/2019 at 5:00 pm at [facility];

## 2019-10-08 NOTE — Addendum Note (Signed)
Addended by: Johnney Killian on: 10/08/2019 11:54 AM   Modules accepted: Orders

## 2019-10-08 NOTE — Telephone Encounter (Addendum)
Called and spoke with patient's wife-Britney-verified DPR-Britney informed of MD and provider's recommendations; Brayton El is agreeable with plan of care to have the MRI ordered/scheduled; order placed for MRI abd w/ MRCP as directed by NP/MD per MD recommendations;  Britney verbalized understanding of information/instructions;  Brayton El was advised to call the office at [phone] if questions/concerns arise;  Britney advised of number to call to schedule MRI [phone];

## 2019-10-08 NOTE — Telephone Encounter (Signed)
Bre, pls contact the patient/wife. I spoke to the patient's wife yesterday about his abd sono report. I recommended an abdominal MRI/MRCP, however, his wife had concerns about the cost of the MRI and wanted to know if there was a less expensive alternative. See Dr Frankey Shown recommendations. Please schedule CT three phase liver with contrast. Wife/patient can contact radiology regarding cost. Let me know what patient elects to do. thx

## 2019-10-09 ENCOUNTER — Encounter: Payer: Managed Care, Other (non HMO) | Admitting: Gastroenterology

## 2019-10-11 ENCOUNTER — Other Ambulatory Visit: Payer: Self-pay

## 2019-10-11 ENCOUNTER — Ambulatory Visit (HOSPITAL_BASED_OUTPATIENT_CLINIC_OR_DEPARTMENT_OTHER)
Admission: RE | Admit: 2019-10-11 | Discharge: 2019-10-11 | Disposition: A | Discharge status: Home / Self Care | Payer: Managed Care, Other (non HMO) | Source: Ambulatory Visit | Attending: Nurse Practitioner | Admitting: Nurse Practitioner

## 2019-10-11 DIAGNOSIS — K824 Cholesterolosis of gallbladder: Secondary | ICD-10-CM | POA: Insufficient documentation

## 2019-10-11 DIAGNOSIS — K769 Liver disease, unspecified: Secondary | ICD-10-CM | POA: Diagnosis present

## 2019-10-11 DIAGNOSIS — K838 Other specified diseases of biliary tract: Secondary | ICD-10-CM | POA: Insufficient documentation

## 2019-10-11 MED ORDER — GADOBUTROL 1 MMOL/ML IV SOLN
10.0000 mL | Freq: Once | INTRAVENOUS | Status: AC | PRN
  Administered 2019-10-11: 10 mL via INTRAVENOUS

## 2019-10-13 ENCOUNTER — Telehealth: Payer: Self-pay | Admitting: Nurse Practitioner

## 2019-10-13 NOTE — Telephone Encounter (Signed)
Please review and advise.

## 2019-10-13 NOTE — Telephone Encounter (Signed)
See MRI MRCP notes with communication with wife

## 2019-10-14 DIAGNOSIS — Z3184 Encounter for fertility preservation procedure: Secondary | ICD-10-CM

## 2019-10-14 DIAGNOSIS — R1013 Epigastric pain: Secondary | ICD-10-CM

## 2019-10-14 DIAGNOSIS — K219 Gastro-esophageal reflux disease without esophagitis: Secondary | ICD-10-CM

## 2019-10-14 DIAGNOSIS — R140 Abdominal distension (gaseous): Secondary | ICD-10-CM

## 2019-10-14 DIAGNOSIS — Z01818 Encounter for other preprocedural examination: Secondary | ICD-10-CM

## 2019-10-29 ENCOUNTER — Other Ambulatory Visit: Payer: Self-pay

## 2019-10-31 ENCOUNTER — Encounter: Payer: Managed Care, Other (non HMO) | Admitting: Gastroenterology

## 2019-11-21 ENCOUNTER — Encounter: Payer: Managed Care, Other (non HMO) | Admitting: Gastroenterology

## 2019-12-09 ENCOUNTER — Ambulatory Visit (INDEPENDENT_AMBULATORY_CARE_PROVIDER_SITE_OTHER): Payer: Managed Care, Other (non HMO)

## 2019-12-09 DIAGNOSIS — Z1159 Encounter for screening for other viral diseases: Secondary | ICD-10-CM

## 2019-12-10 ENCOUNTER — Telehealth: Payer: Self-pay

## 2019-12-10 NOTE — Telephone Encounter (Signed)
Called pt in regards to no show for COVID test.  Spoke to pt's wife Brittney who said that she meant to call office yesterday to cancel pt's procedure for tomorrow.  Requested pt's wife to have pt call to cancel appt but pt's wife said that pt was at work and would be unable to call.  Cancelled EGD that was scheduled for 12/11/19 @ 11:30 am.  Will forward message to notify Dr. Barron Alvine.

## 2019-12-11 ENCOUNTER — Encounter: Payer: Managed Care, Other (non HMO) | Admitting: Gastroenterology

## 2020-04-07 ENCOUNTER — Encounter: Payer: Self-pay | Admitting: Family Medicine

## 2020-04-25 ENCOUNTER — Encounter: Payer: Self-pay | Admitting: Family Medicine

## 2020-05-12 ENCOUNTER — Encounter: Payer: Self-pay | Admitting: Family Medicine

## 2020-05-12 NOTE — Telephone Encounter (Signed)
He really needs to make an appointment to establish with me here.

## 2020-05-13 ENCOUNTER — Telehealth: Payer: Managed Care, Other (non HMO) | Admitting: Family Medicine

## 2020-05-13 NOTE — Telephone Encounter (Signed)
Patient scheduled.

## 2020-05-14 ENCOUNTER — Encounter: Payer: Self-pay | Admitting: Family Medicine

## 2020-05-14 ENCOUNTER — Telehealth (INDEPENDENT_AMBULATORY_CARE_PROVIDER_SITE_OTHER): Payer: Managed Care, Other (non HMO) | Admitting: Family Medicine

## 2020-05-14 DIAGNOSIS — J069 Acute upper respiratory infection, unspecified: Secondary | ICD-10-CM | POA: Insufficient documentation

## 2020-05-14 NOTE — Progress Notes (Signed)
Gene Nielsen - 34 y.o. male MRN [id number]  Date of birth: 1986/03/26   This visit type was conducted due to national recommendations for restrictions regarding the COVID-19 Pandemic (e.g. social distancing).  This format is felt to be most appropriate for this patient at this time.  All issues noted in this document were discussed and addressed.  No physical exam was performed (except for noted visual exam findings with Video Visits).  I discussed the limitations of evaluation and management by telemedicine and the availability of in person appointments. The patient expressed understanding and agreed to proceed.  I connected with@ on 05/14/20 at  9:10 AM EDT by a video enabled telemedicine application and verified that I am speaking with the correct person using two identifiers.  Present at visit: Everrett Coombe, DO Baltasar Hymas   Patient Location: Home [address] [place] [address]   Provider location:   [facility]  Chief Complaint  Patient presents with  . Chest Congestion    HPI  Gene Nielsen is a 34 y.o. male who presents via audio/video conferencing for a telehealth visit today.  He has complaint of mild cough and congestion.  He denies fever, chills, sinus pain, shortness of breath, wheezing, headache, body aches or fatigue.  He has not tried anything for management of symptoms.  He has not had COVID vaccine.     ROS:  A comprehensive ROS was completed and negative except as noted per HPI  Past Medical History:  Diagnosis Date  . Anxiety   . Chronic kidney disease    Only have one kidney   . GERD (gastroesophageal reflux disease) 01/30/2019  . Hypertension     Past Surgical History:  Procedure Laterality Date  . TONSILLECTOMY AND ADENOIDECTOMY    . WISDOM TOOTH EXTRACTION      Family History  Problem Relation Age of Onset  . Depression Mother   . Hypertension Mother   . Colon cancer Neg Hx   . Esophageal cancer Neg Hx     Social History    Socioeconomic History  . Marital status: Married    Spouse name: Not on file  . Number of children: Not on file  . Years of education: Not on file  . Highest education level: Not on file  Occupational History  . Not on file  Tobacco Use  . Smoking status: Former Smoker    Quit date: 11/18/2011    Years since quitting: 8.4  . Smokeless tobacco: Never Used  Vaping Use  . Vaping Use: Never used  Substance and Sexual Activity  . Alcohol use: Yes    Alcohol/week: 0.0 standard drinks  . Drug use: Not Currently  . Sexual activity: Yes    Partners: Female  Other Topics Concern  . Not on file  Social History Narrative  . Not on file   Social Determinants of Health   Financial Resource Strain:   . Difficulty of Paying Living Expenses: Not on file  Food Insecurity:   . Worried About Programme researcher, broadcasting/film/video in the Last Year: Not on file  . Ran Out of Food in the Last Year: Not on file  Transportation Needs:   . Lack of Transportation (Medical): Not on file  . Lack of Transportation (Non-Medical): Not on file  Physical Activity:   . Days of Exercise per Week: Not on file  . Minutes of Exercise per Session: Not on file  Stress:   . Feeling of Stress : Not on file  Social Connections:   . Frequency of Communication with Friends and Family: Not on file  . Frequency of Social Gatherings with Friends and Family: Not on file  . Attends Religious Services: Not on file  . Active Member of Clubs or Organizations: Not on file  . Attends Banker Meetings: Not on file  . Marital Status: Not on file  Intimate Partner Violence:   . Fear of Current or Ex-Partner: Not on file  . Emotionally Abused: Not on file  . Physically Abused: Not on file  . Sexually Abused: Not on file    No current outpatient medications on file.  EXAM:  VITALS per patient if applicable: BP 132/88   Pulse 70   Temp 98.3 F (36.8 C)   Wt 250 lb (113.4 kg)   BMI 31.25 kg/m   GENERAL: alert,  oriented, appears well and in no acute distress  HEENT: atraumatic, conjunttiva clear, no obvious abnormalities on inspection of external nose and ears  NECK: normal movements of the head and neck  LUNGS: on inspection no signs of respiratory distress, breathing rate appears normal, no obvious gross SOB, gasping or wheezing  CV: no obvious cyanosis  MS: moves all visible extremities without noticeable abnormality  PSYCH/NEURO: pleasant and cooperative, no obvious depression or anxiety, speech and thought processing grossly intact  ASSESSMENT AND PLAN:  Discussed the following assessment and plan:  URI with cough and congestion Symptoms consistent with viral URI.  Doesn't seem typical of what is usually seen with COVID.  Discussed that if symptoms worsen he should be tested.  Recommend supportive care with increased fluids and rest.  He may try mucinex and/or delsym for management of symptoms.  He will contact me if his symptoms change or worsen.      I discussed the assessment and treatment plan with the patient. The patient was provided an opportunity to ask questions and all were answered. The patient agreed with the plan and demonstrated an understanding of the instructions.   The patient was advised to call back or seek an in-person evaluation if the symptoms worsen or if the condition fails to improve as anticipated.    Everrett Coombe, DO

## 2020-05-14 NOTE — Progress Notes (Signed)
Symptoms x 4 days:  Cough Chest congestion  No medications currently.  No COVID vaccine.  No possible COVID exposure.

## 2020-05-14 NOTE — Assessment & Plan Note (Signed)
Symptoms consistent with viral URI.  Doesn't seem typical of what is usually seen with COVID.  Discussed that if symptoms worsen he should be tested.  Recommend supportive care with increased fluids and rest.  He may try mucinex and/or delsym for management of symptoms.  He will contact me if his symptoms change or worsen.

## 2020-05-20 ENCOUNTER — Encounter: Payer: Self-pay | Admitting: Family Medicine

## 2020-05-20 ENCOUNTER — Telehealth (INDEPENDENT_AMBULATORY_CARE_PROVIDER_SITE_OTHER): Payer: Managed Care, Other (non HMO) | Admitting: Family Medicine

## 2020-05-20 DIAGNOSIS — J069 Acute upper respiratory infection, unspecified: Secondary | ICD-10-CM | POA: Diagnosis not present

## 2020-05-20 MED ORDER — AZITHROMYCIN 250 MG PO TABS: ORAL_TABLET | ORAL | 0 refills | Status: DC

## 2020-05-20 NOTE — Assessment & Plan Note (Signed)
He continues to have symptoms of productive cough.  Continue increased fluids and mucinex with delsym as needed.  Will add azithromycin as well.  He will let me know if this doesn't resolve.

## 2020-05-20 NOTE — Progress Notes (Signed)
Gene Nielsen - 34 y.o. male MRN [id number]  Date of birth: 05-30-1986   This visit type was conducted due to national recommendations for restrictions regarding the COVID-19 Pandemic (e.g. social distancing).  This format is felt to be most appropriate for this patient at this time.  All issues noted in this document were discussed and addressed.  No physical exam was performed (except for noted visual exam findings with Video Visits).  I discussed the limitations of evaluation and management by telemedicine and the availability of in person appointments. The patient expressed understanding and agreed to proceed.  I connected with@ on 05/20/20 at  9:30 AM EDT by a video enabled telemedicine application and verified that I am speaking with the correct person using two identifiers.  Present at visit: Gene Coombe, DO Taylen Brinegar   Patient Location: Home [address] [place] [address]   Provider location:   [facility]  No chief complaint on file.   HPI  Gene Nielsen is a 34 y.o. male who presents via Web designer for a telehealth visit today.  He has complaint today of continued productive cough and congestion.  He has been using mucinex and delsym which has helped some.  He is producing yellow sputum with cough.  He denies fever, chills, wheezing, shortness of breath, sinus pain.     ROS:  A comprehensive ROS was completed and negative except as noted per HPI  Past Medical History:  Diagnosis Date  . Anxiety   . Chronic kidney disease    Only have one kidney   . GERD (gastroesophageal reflux disease) 01/30/2019  . Hypertension     Past Surgical History:  Procedure Laterality Date  . TONSILLECTOMY AND ADENOIDECTOMY    . WISDOM TOOTH EXTRACTION      Family History  Problem Relation Age of Onset  . Depression Mother   . Hypertension Mother   . Colon cancer Neg Hx   . Esophageal cancer Neg Hx     Social History   Socioeconomic History  .  Marital status: Married    Spouse name: Not on file  . Number of children: Not on file  . Years of education: Not on file  . Highest education level: Not on file  Occupational History  . Not on file  Tobacco Use  . Smoking status: Former Smoker    Quit date: 11/18/2011    Years since quitting: 8.5  . Smokeless tobacco: Never Used  Vaping Use  . Vaping Use: Never used  Substance and Sexual Activity  . Alcohol use: Yes    Alcohol/week: 0.0 standard drinks  . Drug use: Not Currently  . Sexual activity: Yes    Partners: Female  Other Topics Concern  . Not on file  Social History Narrative  . Not on file   Social Determinants of Health   Financial Resource Strain:   . Difficulty of Paying Living Expenses: Not on file  Food Insecurity:   . Worried About Programme researcher, broadcasting/film/video in the Last Year: Not on file  . Ran Out of Food in the Last Year: Not on file  Transportation Needs:   . Lack of Transportation (Medical): Not on file  . Lack of Transportation (Non-Medical): Not on file  Physical Activity:   . Days of Exercise per Week: Not on file  . Minutes of Exercise per Session: Not on file  Stress:   . Feeling of Stress : Not on file  Social Connections:   . Frequency  of Communication with Friends and Family: Not on file  . Frequency of Social Gatherings with Friends and Family: Not on file  . Attends Religious Services: Not on file  . Active Member of Clubs or Organizations: Not on file  . Attends Banker Meetings: Not on file  . Marital Status: Not on file  Intimate Partner Violence:   . Fear of Current or Ex-Partner: Not on file  . Emotionally Abused: Not on file  . Physically Abused: Not on file  . Sexually Abused: Not on file     Current Outpatient Medications:  .  azithromycin (ZITHROMAX) 250 MG tablet, Take 2 tablets on day one followed by 1 tablet on days 2-5, Disp: 6 tablet, Rfl: 0  EXAM:  VITALS per patient if applicable: There were no vitals taken  for this visit.  GENERAL: alert, oriented, appears well and in no acute distress  HEENT: atraumatic, conjunttiva clear, no obvious abnormalities on inspection of external nose and ears  NECK: normal movements of the head and neck  LUNGS: on inspection no signs of respiratory distress, breathing rate appears normal, no obvious gross SOB, gasping or wheezing  CV: no obvious cyanosis  MS: moves all visible extremities without noticeable abnormality  PSYCH/NEURO: pleasant and cooperative, no obvious depression or anxiety, speech and thought processing grossly intact  ASSESSMENT AND PLAN:  Discussed the following assessment and plan:  URI with cough and congestion He continues to have symptoms of productive cough.  Continue increased fluids and mucinex with delsym as needed.  Will add azithromycin as well.  He will let me know if this doesn't resolve.        I discussed the assessment and treatment plan with the patient. The patient was provided an opportunity to ask questions and all were answered. The patient agreed with the plan and demonstrated an understanding of the instructions.   The patient was advised to call back or seek an in-person evaluation if the symptoms worsen or if the condition fails to improve as anticipated.    Gene Coombe, DO

## 2020-06-23 ENCOUNTER — Encounter: Payer: Self-pay | Admitting: Family Medicine

## 2020-08-16 ENCOUNTER — Encounter: Payer: Self-pay | Admitting: Family Medicine

## 2021-04-30 IMAGING — US US ABDOMEN COMPLETE
1 series · 13 of 25 positions shown · non-contrast
Comparison: Report 08/24/2004

CLINICAL DATA: Chest and abdominal pain, bloating

EXAM:
ABDOMEN ULTRASOUND COMPLETE

[Series 1: us abdomen complete · 13 of 136 slices shown]
[im 1/136]
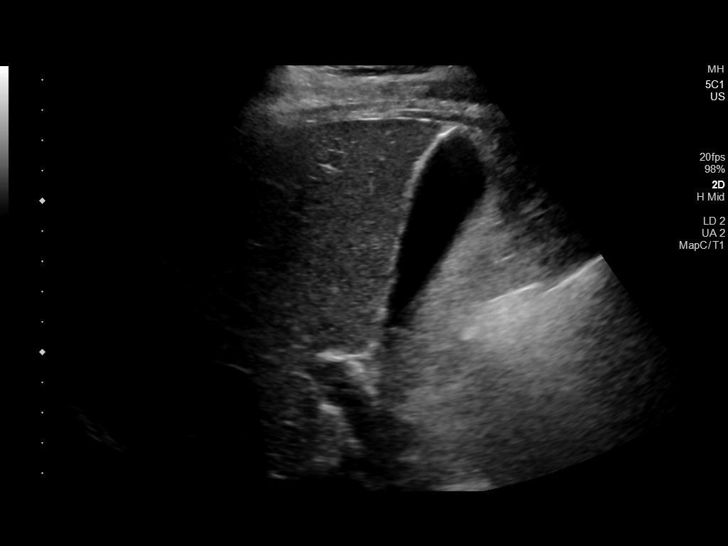
[im 12/136]
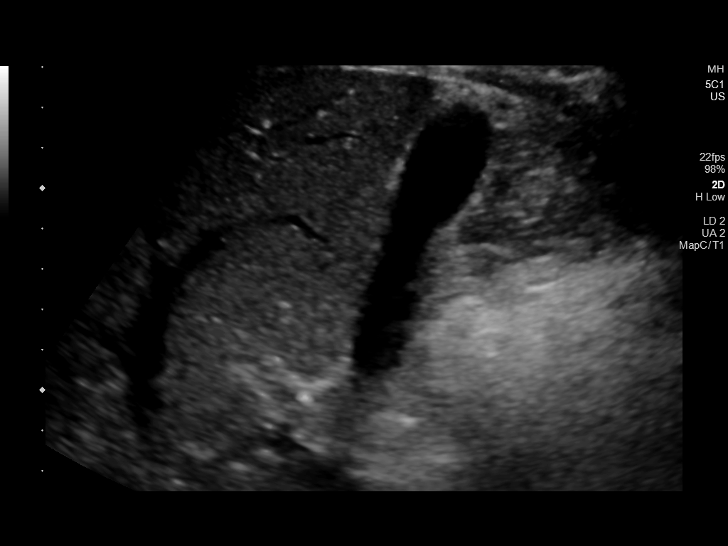
[im 23/136]
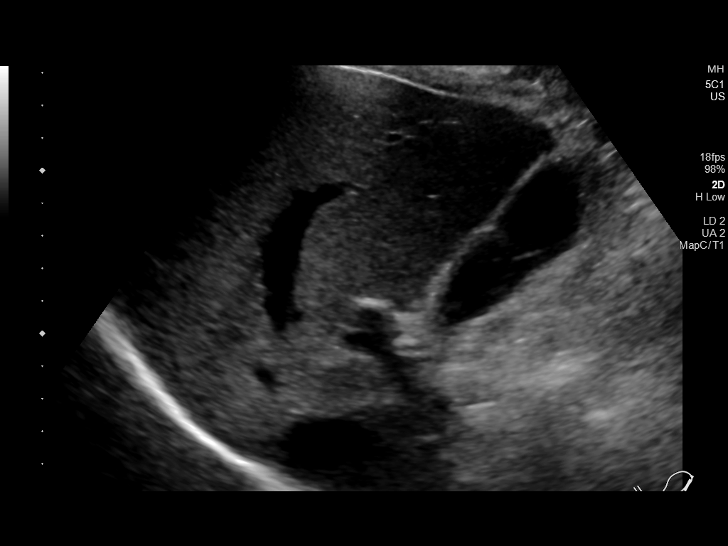
[im 34/136]
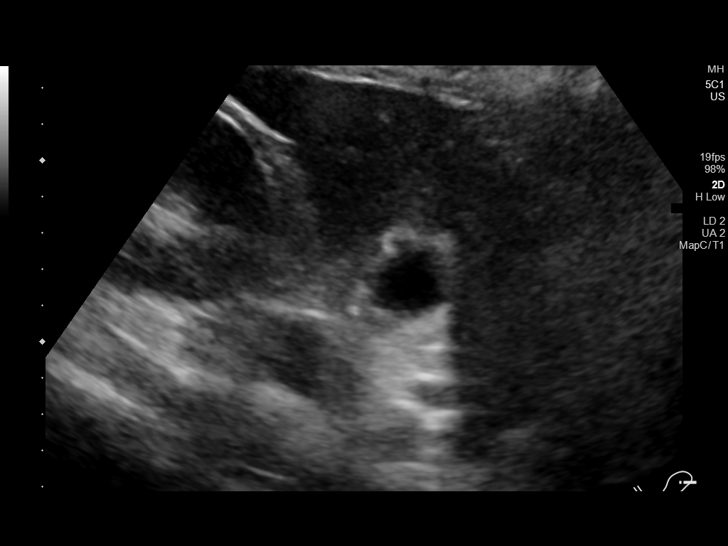
[im 46/136]
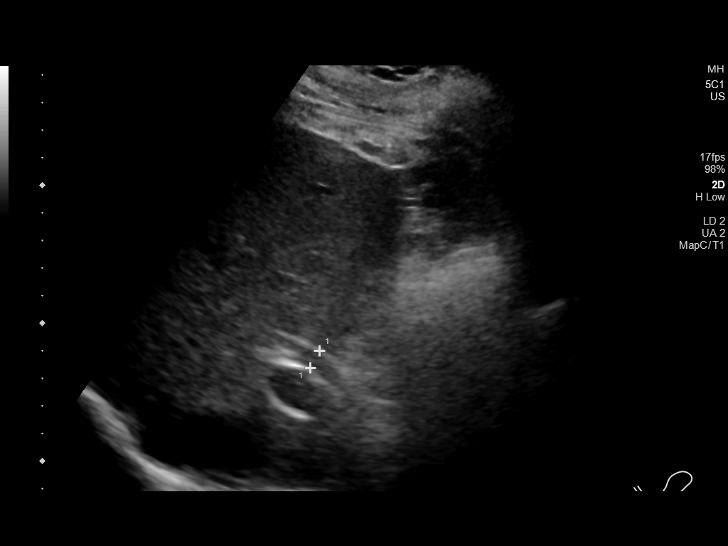
[im 57/136]
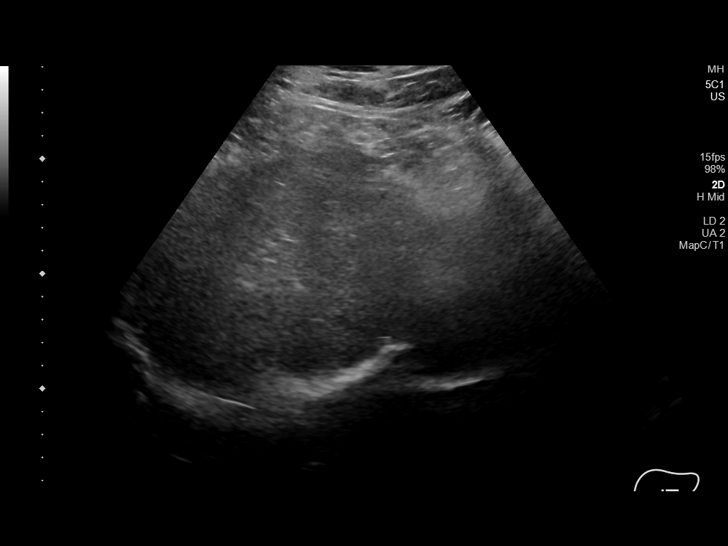
[im 68/136]
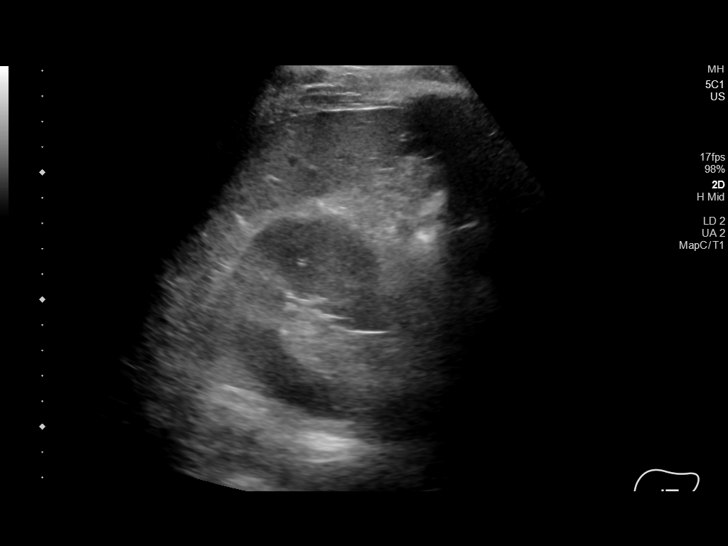
[im 79/136]
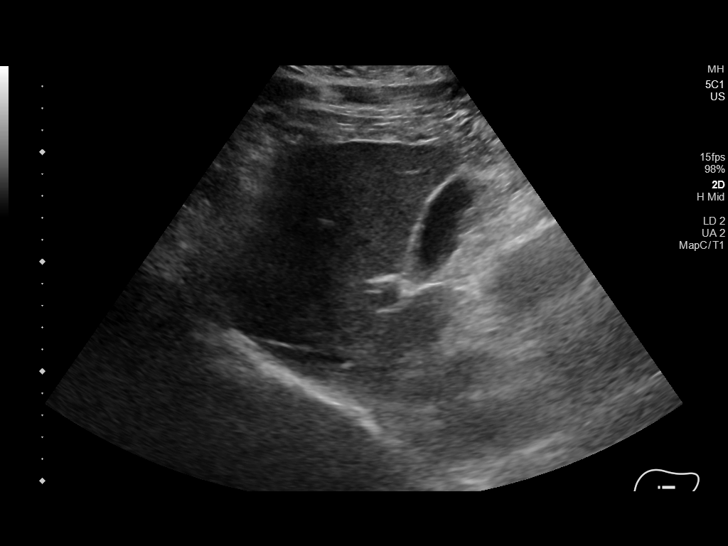
[im 91/136]
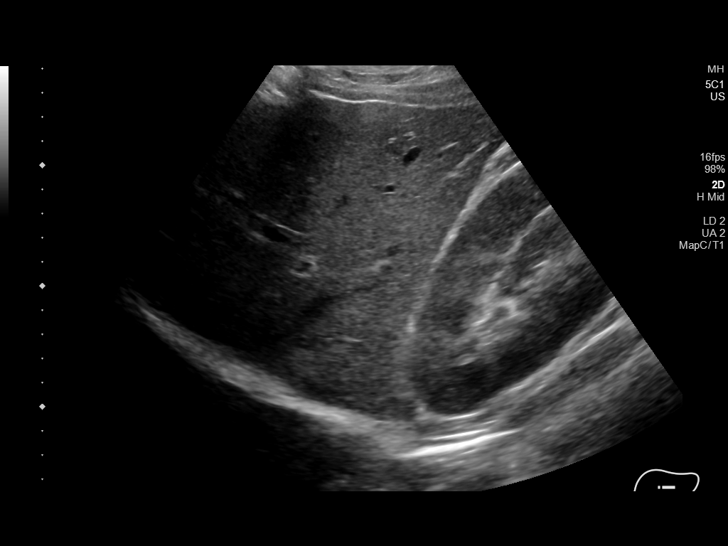
[im 102/136]
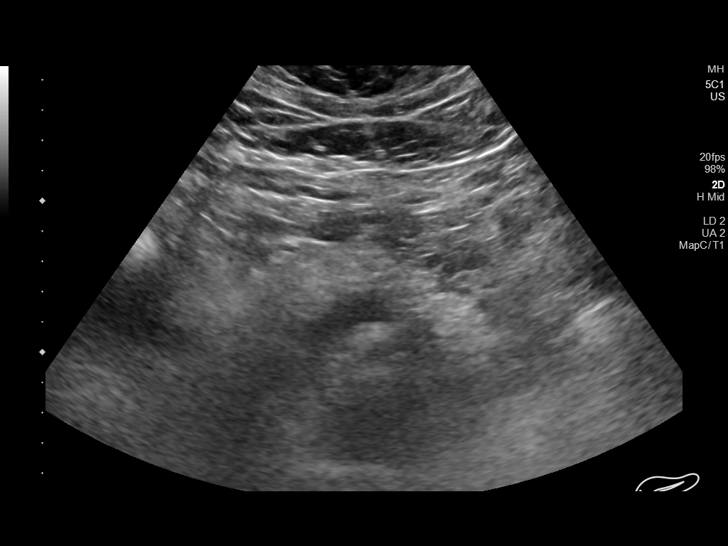
[im 113/136]
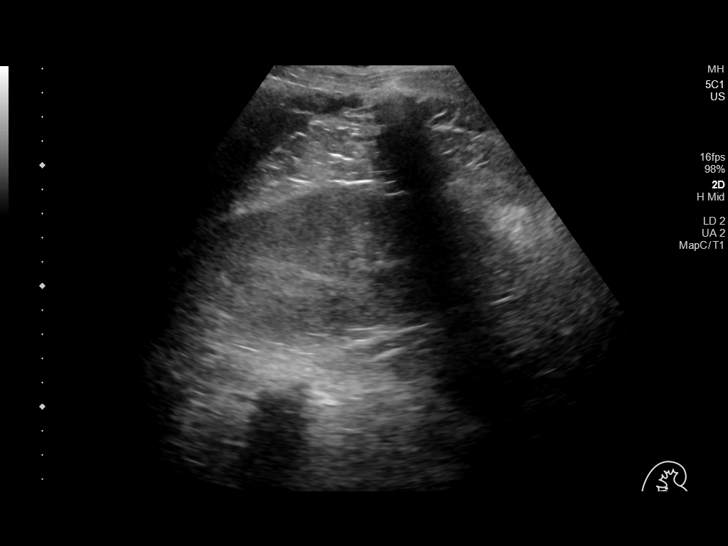
[im 124/136]
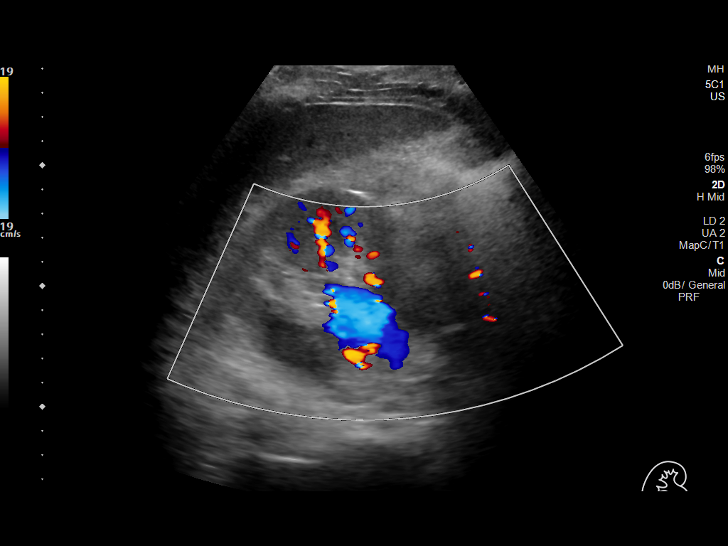
[im 136/136]
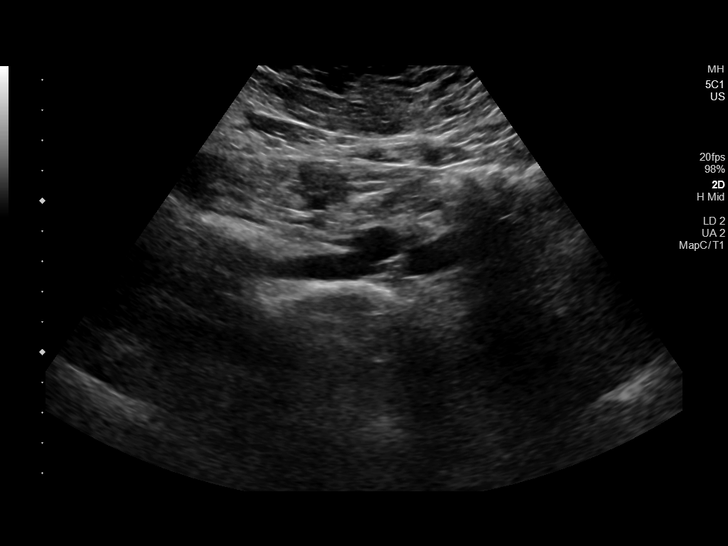

[13 of 25 positions shown; findings below may reference images not displayed]

FINDINGS: Gallbladder: Small polyp measuring up to 2 mm. Normal wall
thickness. Negative sonographic Murphy. No shadowing stone.

Common bile duct: Diameter: 7 mm

Liver: Liver is slightly echogenic. Focal hyperechoic mass in the
left hepatic lobe measuring 1.8 x 1.2 x 1.1 cm. Portal vein is
patent on color Doppler imaging with normal direction of blood flow
towards the liver.

IVC: No abnormality visualized.

Pancreas: Visualized portion unremarkable.

Spleen: Size and appearance within normal limits.

Right Kidney: Length: 14.1 cm. Echogenicity within normal limits. No
mass or hydronephrosis visualized.

Left Kidney: Nonvisualized

Abdominal aorta: No aneurysm visualized.

Other findings: None.
IMPRESSION: 1. Small gallbladder polyp. Negative for shadowing stone or acute
gallbladder disease.
2. Borderline dilatation of the common bile duct. Correlate with
LFTs. If obstruction is suspected, further evaluation with MRCP
could be obtained
3. Slightly echogenic liver suggesting steatosis. Focal 1.8 cm
echogenic liver mass, question hemangioma. MRI could be obtained for
further evaluation.
4. Nonvisualized left kidney. This is either due to significant
atrophy or congenital absence.

## 2021-05-05 IMAGING — MR MR ABDOMEN WO/W CM MRCP
13 of 21 series · 25 of 48 positions shown · IV contrast (gadavist)
Comparison: No prior abdominal MRI. Abdominal ultrasound
10/06/2019.

CLINICAL DATA: 33-year-old male with abnormal ultrasound. Follow-up
study.

EXAM:
MRI ABDOMEN WITHOUT AND WITH CONTRAST (INCLUDING MRCP)
TECHNIQUE: Multiplanar multisequence MR imaging of the abdomen was performed
both before and after the administration of intravenous contrast.
Heavily T2-weighted images of the biliary and pancreatic ducts were
obtained, and three-dimensional MRCP images were rendered by post
processing.
CONTRAST:  10mL GADAVIST GADOBUTROL 1 MMOL/ML IV SOLN

[Series 3: T2 · coronal · 7.0mm · 1.45mm/px · 1 of 32 slices shown (1 of 2)]
[im 1/32]
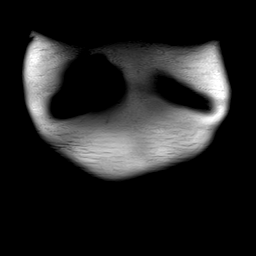

[Series 4: T2 fat-sat · axial · 6.0mm · 1.48mm/px · 1 of 40 slices shown]
[im 1/40]
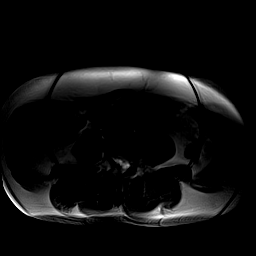

[Series 5: in + out · axial · 6.0mm · 0.74mm/px · z∈[-170,+108]mm · 2 of 76 slices shown]
[im 1/76]
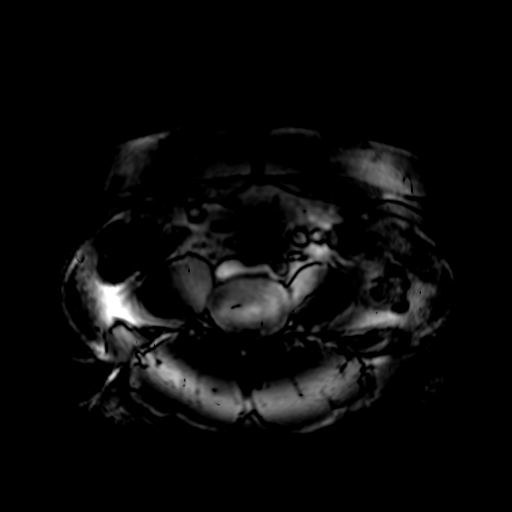
[im 76/76]
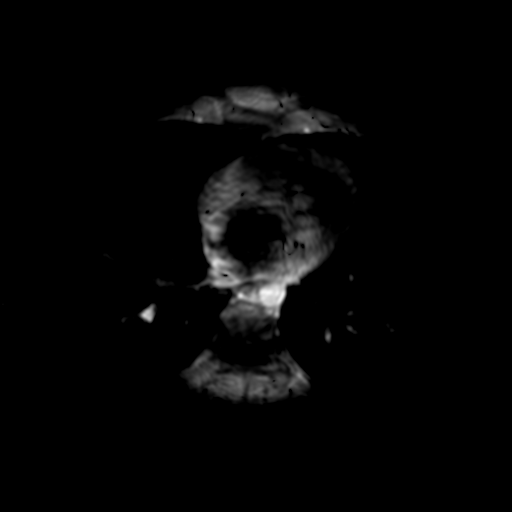

[Series 8: t2_spc_rst_cor_p3_iso_trig_384 · coronal · 1.0mm · 0.99mm/px · 3 of 87 slices shown]
[im 1/87]
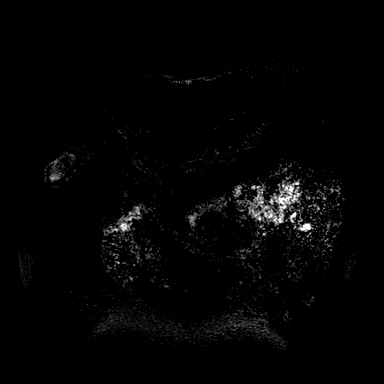
[im 44/87]
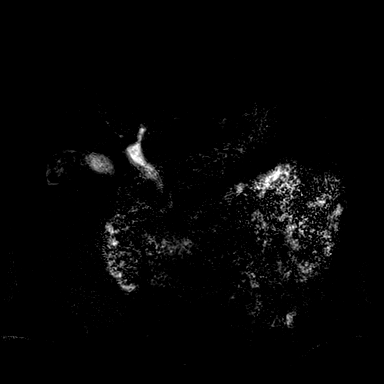
[im 87/87]
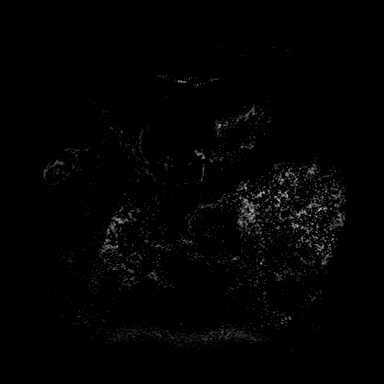

[Series 10: t2_haste_cor_thin_radial · coronal · 4.0mm · 1.06mm/px · 1 of 7 slices shown (1 of 2)]
[im 1/7]
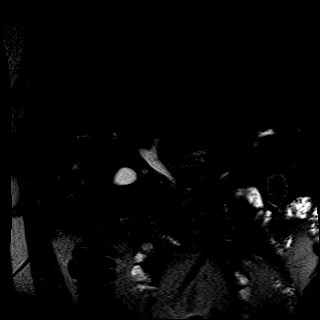

[Series 11: t2_haste_fs_cor_thick_radial · coronal · 50.0mm · 0.78mm/px · 1 of 7 slices shown]
[im 1/7]
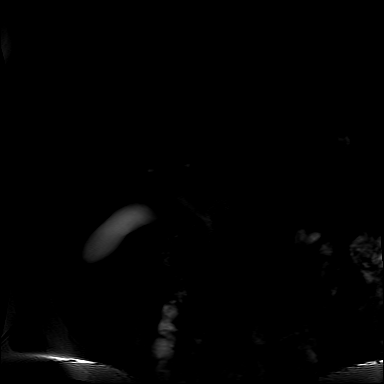

[Series 12: t2_haste_cor_thin_radial · coronal · 4.0mm · 1.06mm/px · 1 of 7 slices shown (2 of 2)]
[im 1/7]
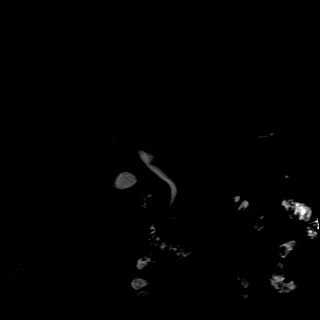

[Series 13: ep2d_diff_b50_500_800 free breathing · axial · 6.0mm · 1.98mm/px · z∈[-149,+144]mm · 4 of 120 slices shown]
[im 1/120]
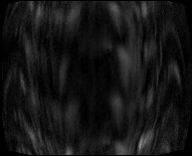
[im 40/120]
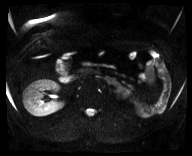
[im 80/120]
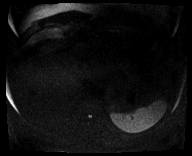
[im 120/120]
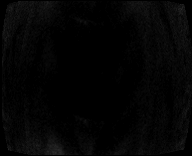

[Series 14: ep2d_diff_b50_500_800 free breathing_adc · axial · 6.0mm · 1.98mm/px · 1 of 40 slices shown]
[im 1/40]
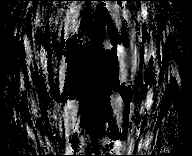

[Series 16: DWI · axial · 6.0mm · 0.74mm/px · z∈[-170,+108]mm · 2 of 38 slices shown]
[im 1/38]
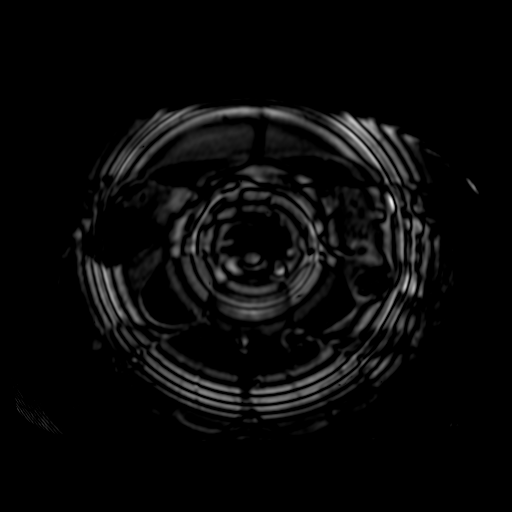
[im 38/38]
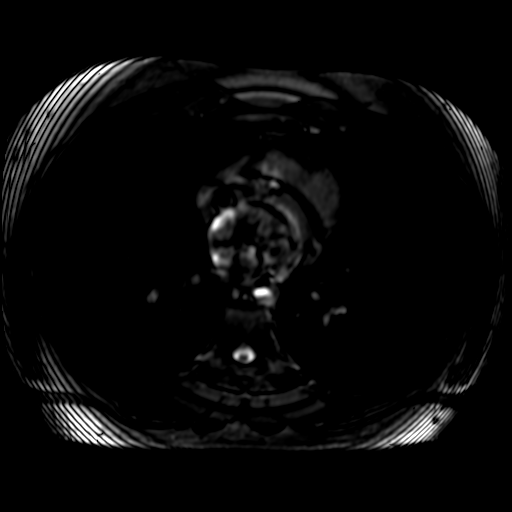

[Series 17: T2 · axial · 6.0mm · 1.48mm/px · z∈[-170,+108]mm · 2 of 38 slices shown (2 of 2)]
[im 1/38]
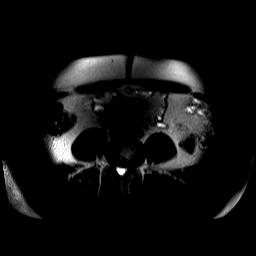
[im 38/38]
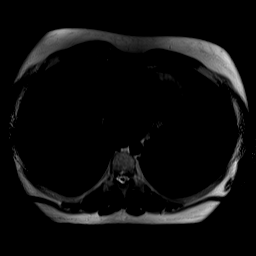

[Series 26: T1 dynamic fat-sat post-contrast · axial · delayed · 5.0mm · 1.32mm/px · z∈[-179,+116]mm · 3 of 60 slices shown (1 of 2)]
[im 1/60]
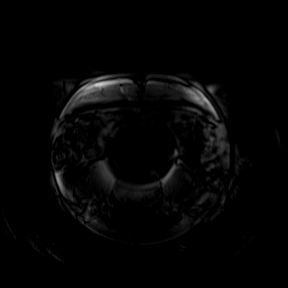
[im 30/60]
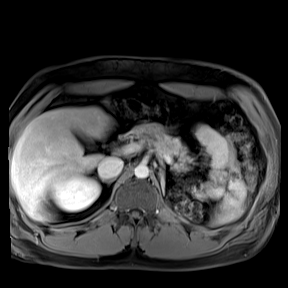
[im 60/60]
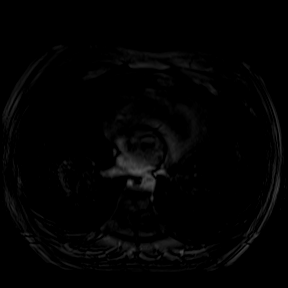

[Series 27: T1 dynamic fat-sat post-contrast · axial · 5.0mm · 1.32mm/px · z∈[-179,+116]mm · 3 of 60 slices shown (2 of 2)]
[im 1/60]
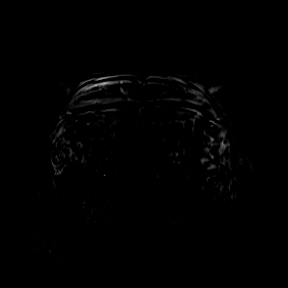
[im 30/60]
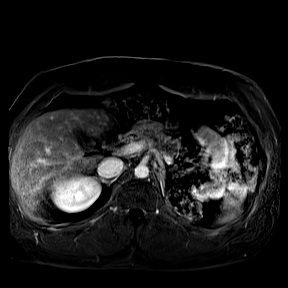
[im 60/60]
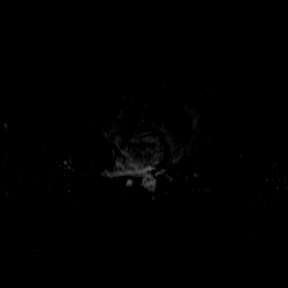

[25 of 48 positions shown; findings below may reference images not displayed]

FINDINGS: Lower chest: Unremarkable.

Hepatobiliary: No suspicious cystic or solid hepatic lesions.
Gallbladder is normal in appearance. No intra or extrahepatic
biliary ductal dilatation. No filling defect in the common bile duct
to suggest choledocholithiasis.

Pancreas: No pancreatic mass. No pancreatic ductal dilatation. No
pancreatic or peripancreatic fluid collections or inflammatory
changes.

Spleen:  Unremarkable.

Adrenals/Urinary Tract: Status post left nephrectomy. Right kidney
and bilateral adrenal glands are normal in appearance. No
hydroureteronephrosis in the visualized portions of the abdomen.

Stomach/Bowel: Visualized portions are unremarkable.

Vascular/Lymphatic: Aortic atherosclerosis. No lymphadenopathy noted
in the abdomen.

Other: No significant volume of ascites noted in the visualized
portions of the abdomen.

Musculoskeletal: No aggressive appearing osseous lesions are noted
in the visualized portions of the skeleton.
IMPRESSION: 1. The suspected gallbladder polyp seen on the prior ultrasound is
not well demonstrated on today's MRI (not unexpected given the small
2 mm size on the prior ultrasound examination).
2. No biliary tract dilatation.  No choledocholithiasis.
3. Aortic atherosclerosis.

## 2021-08-10 ENCOUNTER — Encounter: Payer: Self-pay | Admitting: Family Medicine

## 2021-08-10 NOTE — Telephone Encounter (Signed)
Needs OV, next available regular OV as this is non-urgent.

## 2021-09-08 ENCOUNTER — Encounter: Payer: Self-pay | Admitting: Family Medicine

## 2021-09-08 ENCOUNTER — Ambulatory Visit: Payer: 59 | Admitting: Family Medicine

## 2021-09-08 ENCOUNTER — Other Ambulatory Visit: Payer: Self-pay

## 2021-09-08 VITALS — BP 135/73 | HR 56 | Temp 97.8°F | Ht 75.0 in | Wt 258.0 lb

## 2021-09-08 DIAGNOSIS — Q600 Renal agenesis, unilateral: Secondary | ICD-10-CM

## 2021-09-08 DIAGNOSIS — K219 Gastro-esophageal reflux disease without esophagitis: Secondary | ICD-10-CM

## 2021-09-08 DIAGNOSIS — B351 Tinea unguium: Secondary | ICD-10-CM

## 2021-09-08 DIAGNOSIS — M940 Chondrocostal junction syndrome [Tietze]: Secondary | ICD-10-CM

## 2021-09-08 DIAGNOSIS — I10 Essential (primary) hypertension: Secondary | ICD-10-CM | POA: Diagnosis not present

## 2021-09-08 LAB — CBC WITH DIFFERENTIAL/PLATELET
Absolute Monocytes: 274 cells/uL (ref 200–950)
Basophils Absolute: 59 cells/uL (ref 0–200)
Basophils Relative: 1.6 %
Eosinophils Absolute: 70 cells/uL (ref 15–500)
Eosinophils Relative: 1.9 %
HCT: 45.5 % (ref 38.5–50.0)
Hemoglobin: 15.5 g/dL (ref 13.2–17.1)
Lymphs Abs: 1765 cells/uL (ref 850–3900)
MCH: 30.5 pg (ref 27.0–33.0)
MCHC: 34.1 g/dL (ref 32.0–36.0)
MCV: 89.4 fL (ref 80.0–100.0)
MPV: 11.7 fL (ref 7.5–12.5)
Monocytes Relative: 7.4 %
Neutro Abs: 1532 cells/uL (ref 1500–7800)
Neutrophils Relative %: 41.4 %
Platelets: 284 10*3/uL (ref 140–400)
RBC: 5.09 10*6/uL (ref 4.20–5.80)
RDW: 12.8 % (ref 11.0–15.0)
Total Lymphocyte: 47.7 %
WBC: 3.7 10*3/uL — ABNORMAL LOW (ref 3.8–10.8)

## 2021-09-08 LAB — COMPLETE METABOLIC PANEL WITH GFR
AG Ratio: 2.3 (calc) (ref 1.0–2.5)
ALT: 51 U/L — ABNORMAL HIGH (ref 9–46)
AST: 26 U/L (ref 10–40)
Albumin: 5 g/dL (ref 3.6–5.1)
Alkaline phosphatase (APISO): 43 U/L (ref 36–130)
BUN: 18 mg/dL (ref 7–25)
CO2: 27 mmol/L (ref 20–32)
Calcium: 10 mg/dL (ref 8.6–10.3)
Chloride: 105 mmol/L (ref 98–110)
Creat: 0.96 mg/dL (ref 0.60–1.26)
Globulin: 2.2 g/dL (calc) (ref 1.9–3.7)
Glucose, Bld: 88 mg/dL (ref 65–99)
Potassium: 4.8 mmol/L (ref 3.5–5.3)
Sodium: 140 mmol/L (ref 135–146)
Total Bilirubin: 0.7 mg/dL (ref 0.2–1.2)
Total Protein: 7.2 g/dL (ref 6.1–8.1)
eGFR: 106 mL/min/{1.73_m2} (ref >=60)

## 2021-09-08 MED ORDER — MELOXICAM 15 MG PO TABS: 15.0000 mg | ORAL_TABLET | Freq: Every day | ORAL | 0 refills | Status: DC | PRN

## 2021-09-08 MED ORDER — CICLOPIROX 8 % EX SOLN: Freq: Every day | CUTANEOUS | 0 refills | Status: DC

## 2021-09-08 NOTE — Patient Instructions (Addendum)
Try adding meloxicam daily as needed.  Try heat to the chest wall.  Continue omeprazole as needed for heartburn symptoms.  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  We are checking your kidney function today.    Costochondritis Costochondritis is irritation and swelling (inflammation) of the tissue that connects the ribs to the breastbone (sternum). This tissue is called cartilage. Costochondritis causes pain in the front of the chest. Usually, the pain: Starts slowly. Is in more than one rib. What are the causes? The exact cause of this condition is not always known. It results from stress on the tissue in the affected area. The cause of this stress could be: Chest injury. Exercise or activity, such as lifting. Very bad coughing. What increases the risk? You are more likely to develop this condition if you: Are male. Are 55-81 years old. Recently started a new exercise or work activity. Have low levels of vitamin D. Have a condition that makes you cough often. What are the signs or symptoms? The main symptom of this condition is chest pain. The pain: Usually starts slowly and can be sharp or dull. Gets worse with deep breathing, coughing, or exercise. Gets better with rest. May be worse when you press on the affected area of your ribs and breastbone. How is this treated? This condition usually goes away on its own over time. Your doctor may prescribe an NSAID, such as ibuprofen. This can help reduce pain and inflammation. Treatment may also include: Resting and avoiding activities that make pain worse. Putting heat or ice on the painful area. Doing exercises to stretch your chest muscles. If these treatments do not help, your doctor may inject a numbing medicine to help relieve the pain. Follow these instructions at home: Managing pain, stiffness, and swelling   If told, put ice on the painful area. To do this: Put ice in a plastic bag. Place a towel  between your skin and the bag. Leave the ice on for 20 minutes, 2-3 times a day. If told, put heat on the affected area. Do this as often as told by your doctor. Use the heat source that your doctor recommends, such as a moist heat pack or a heating pad. Place a towel between your skin and the heat source. Leave the heat on for 20-30 minutes. Take off the heat if your skin turns bright red. This is very important if you cannot feel pain, heat, or cold. You may have a greater risk of getting burned. Activity Rest as told by your doctor. Do not do anything that makes your pain worse. This includes any activities that use chest, belly (abdomen), and side muscles. Do not lift anything that is heavier than 10 lb (4.5 kg), or the limit that you are told, until your doctor says that it is safe. Return to your normal activities as told by your doctor. Ask your doctor what activities are safe for you. General instructions Take over-the-counter and prescription medicines only as told by your doctor. Keep all follow-up visits as told by your doctor. This is important. Contact a doctor if: You have chills or a fever. Your pain does not go away or it gets worse. You have a cough that does not go away. Get help right away if: You are short of breath. You have very bad chest pain that is not helped by medicines, heat, or ice. These symptoms may be an emergency. Do not wait to see if the symptoms  will go away. Get medical help right away. Call your local emergency services (911 in the [place] Do not drive yourself to the hospital. Summary Costochondritis is irritation and swelling (inflammation) of the tissue that connects the ribs to the breastbone (sternum). This condition causes pain in the front of the chest. Treatment may include medicines, rest, heat or ice, and exercises. This information is not intended to replace advice given to you by your health care provider. Make sure you discuss any questions  you have with your health care provider. Document Revised: 06/06/2019 Document Reviewed: 06/06/2019 Elsevier Patient Education  2022 [employer].

## 2021-09-08 NOTE — Assessment & Plan Note (Signed)
Adding meloxicam 15 mg daily as needed.  Can try heat to area as needed.  Recommend icing after exercise.

## 2021-09-08 NOTE — Assessment & Plan Note (Signed)
Blood pressure is well controlled today.  He will continue to follow low-sodium diet and regular exercise.

## 2021-09-08 NOTE — Assessment & Plan Note (Signed)
Using omeprazole as needed.

## 2021-09-08 NOTE — Assessment & Plan Note (Signed)
Treat with course of [place].

## 2021-09-08 NOTE — Progress Notes (Signed)
Gene Nielsen - 36 y.o. male MRN [id number]  Date of birth: [birth year]  Subjective Chief Complaint  Patient presents with   Annual Exam    HPI Gene Nielsen is a 36 year old male here today for follow-up visit.  It has been quite a while since I have seen him.  He has made significant changes to his diet and exercise to get his blood pressure under good control.  Readings are currently well controlled without medication.  He does have complaint of pain at the sternochondral junction on the right side of his chest.  He has had this for several months.  He is exercising more often and notices it more when working out.  He denies shortness of breath, palpitations or nausea.  He does have some reflux but this is well controlled with omeprazole as needed.  He is also having some problems with brittle, thickened toenails.  This is worse on the great toe of the left foot.   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    GERD (gastroesophageal reflux disease) 01/30/2019   Hypertension     Past Surgical History:  Procedure Laterality Date   TONSILLECTOMY AND ADENOIDECTOMY     WISDOM TOOTH EXTRACTION      Social History   Socioeconomic History   Marital status: Married    Spouse name: Not on file   Number of children: Not on file   Years of education: Not on file   Highest education level: Not on file  Occupational History   Not on file  Tobacco Use   Smoking status: Former    Types: Cigarettes    Quit date: 11/18/2011    Years since quitting: 9.8   Smokeless tobacco: Never  Vaping Use   Vaping Use: Never used  Substance and Sexual Activity   Alcohol use: Yes    Alcohol/week: 0.0 standard drinks   Drug use: Not Currently   Sexual activity: Yes    Partners: Female  Other Topics Concern    Not on file  Social History Narrative   Not on file   Social Determinants of Health   Financial Resource Strain: Not on file  Food Insecurity: Not on file  Transportation Needs: Not on file  Physical Activity: Not on file  Stress: Not on file  Social Connections: Not on file    Family History  Problem Relation Age of Onset   Depression Mother    Hypertension Mother    Colon cancer Neg Hx    Esophageal cancer Neg Hx     Health Maintenance  Topic Date Due   Hepatitis C Screening  Never done   TETANUS/TDAP  11/26/2021   HIV Screening  Completed   HPV VACCINES  Aged Out   INFLUENZA VACCINE  Discontinued   COVID-19 Vaccine  Discontinued     ----------------------------------------------------------------------------------------------------------------------------------------------------------------------------------------------------------------- Physical Exam BP 135/73 (BP Location: Left Arm, Patient Position: Sitting, Cuff Size: Large)    Pulse (!) 56    Temp 97.8 F (36.6 C)    Ht 6\' 3"  (1.905 m)    Wt 258 lb (117 kg)    SpO2 99%    BMI 32.25 kg/m   Physical Exam Constitutional:      Appearance: Normal appearance.  HENT:  Head: Normocephalic and atraumatic.  Eyes:     General: No scleral icterus. Cardiovascular:     Rate and Rhythm: Normal rate and regular rhythm.  Pulmonary:     Effort: Pulmonary effort is normal.     Breath sounds: Normal breath sounds.  Chest:     Chest wall: Tenderness (Tenderness to palpation along the right sternal chondral junction.) present.  Musculoskeletal:     Cervical back: Neck supple.  Neurological:     General: No focal deficit present.     Mental Status: He is alert.  Psychiatric:        Mood and Affect: Mood normal.        Behavior: Behavior normal.     ------------------------------------------------------------------------------------------------------------------------------------------------------------------------------------------------------------------- Assessment and Plan  HTN (hypertension) Blood pressure is well controlled today.  He will continue to follow low-sodium diet and regular exercise.  GERD (gastroesophageal reflux disease) Using omeprazole as needed.  Onychomycosis Treat with course of Penlac.  Costochondritis Adding meloxicam 15 mg daily as needed.  Can try heat to area as needed.  Recommend icing after exercise.   Meds ordered this encounter  Medications   meloxicam (MOBIC) 15 MG tablet    Sig: Take 1 tablet (15 mg total) by mouth daily as needed for pain.    Dispense:  30 tablet    Refill:  0   ciclopirox (PENLAC) 8 % solution    Sig: Apply topically at bedtime. Apply over nail and surrounding skin. Apply daily over previous coat. After seven (7) days, may remove with alcohol and continue cycle.    Dispense:  6.6 mL    Refill:  0    No follow-ups on file.    This visit occurred during the SARS-CoV-2 public health emergency.  Safety protocols were in place, including screening questions prior to the visit, additional usage of staff PPE, and extensive cleaning of exam room while observing appropriate contact time as indicated for disinfecting solutions.

## 2021-09-09 ENCOUNTER — Encounter: Payer: Self-pay | Admitting: Family Medicine

## 2021-09-29 ENCOUNTER — Encounter: Payer: Self-pay | Admitting: Family Medicine

## 2021-09-29 DIAGNOSIS — K219 Gastro-esophageal reflux disease without esophagitis: Secondary | ICD-10-CM

## 2022-01-30 ENCOUNTER — Encounter: Payer: Self-pay | Admitting: Family Medicine

## 2022-06-01 ENCOUNTER — Telehealth: Payer: 59 | Admitting: Family Medicine

## 2022-06-09 ENCOUNTER — Telehealth: Payer: 59 | Admitting: Family Medicine

## 2022-07-11 ENCOUNTER — Telehealth: Payer: 59 | Admitting: Family Medicine

## 2022-07-13 ENCOUNTER — Ambulatory Visit: Payer: 59 | Admitting: Family Medicine

## 2022-08-23 ENCOUNTER — Ambulatory Visit: Payer: 59 | Admitting: Physician Assistant

## 2022-09-07 ENCOUNTER — Encounter: Payer: Self-pay | Admitting: Family Medicine

## 2022-09-07 ENCOUNTER — Telehealth (INDEPENDENT_AMBULATORY_CARE_PROVIDER_SITE_OTHER): Payer: Self-pay | Admitting: Family Medicine

## 2022-09-07 DIAGNOSIS — R059 Cough, unspecified: Secondary | ICD-10-CM

## 2022-09-07 MED ORDER — ALBUTEROL SULFATE HFA 108 (90 BASE) MCG/ACT IN AERS: 2.0000 | INHALATION_SPRAY | Freq: Four times a day (QID) | RESPIRATORY_TRACT | 0 refills | Status: DC | PRN

## 2022-09-07 MED ORDER — BENZONATATE 200 MG PO CAPS: 200.0000 mg | ORAL_CAPSULE | Freq: Two times a day (BID) | ORAL | 0 refills | Status: DC | PRN

## 2022-09-07 MED ORDER — PREDNISONE 50 MG PO TABS: ORAL_TABLET | ORAL | 0 refills | Status: DC

## 2022-09-07 NOTE — Assessment & Plan Note (Signed)
Course of prednisone with albuterol inhaler as needed.  Tessalon Perles as needed for cough.  We discussed that if not improving in the next couple weeks I would like to see him in the clinic for pulmonary exam and consideration of chest x-ray.

## 2022-09-07 NOTE — Progress Notes (Signed)
Gene Nielsen - 37 y.o. male MRN [id number]  Date of birth: 1985/12/12   This visit type was conducted due to national recommendations for restrictions regarding the COVID-19 Pandemic (e.g. social distancing).  This format is felt to be most appropriate for this patient at this time.  All issues noted in this document were discussed and addressed.  No physical exam was performed (except for noted visual exam findings with Video Visits).  I discussed the limitations of evaluation and management by telemedicine and the availability of in person appointments. The patient expressed understanding and agreed to proceed.  I connected withNAME@ on 09/07/22 at  3:10 PM EST by a video enabled telemedicine application and verified that I am speaking with the correct person using two identifiers.  Present at visit: Gene Nutting, DO Gene Nielsen   Patient Location: [place] [address]   Provider location:   [place]  No chief complaint on file.   HPI  Gene Nielsen is a 37 y.o. male who presents via Engineer, civil (consulting) for a telehealth visit today.  He reports having cough for approximately 1 month.  Cough is mostly nonproductive.  He has not had significant shortness of Breath but does have some occasional wheezing.  Denies fever or chills.  He has not had any reflux symptoms, significant nasal congestion.  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    GERD (gastroesophageal reflux disease) 01/30/2019   Hypertension     Past Surgical History:  Procedure Laterality Date   TONSILLECTOMY AND ADENOIDECTOMY     WISDOM TOOTH EXTRACTION      Family History  Problem Relation Age of Onset   Depression Mother    Hypertension Mother     Colon cancer Neg Hx    Esophageal cancer Neg Hx     Social History   Socioeconomic History   Marital status: Married    Spouse name: Not on file   Number of children: Not on file   Years of education: Not on file   Highest education level: Not on file  Occupational History   Not on file  Tobacco Use   Smoking status: Former    Types: Cigarettes    Quit date: 11/18/2011    Years since quitting: 10.8   Smokeless tobacco: Never  Vaping Use   Vaping Use: Never used  Substance and Sexual Activity   Alcohol use: Yes    Alcohol/week: 0.0 standard drinks of alcohol   Drug use: Not Currently   Sexual activity: Yes    Partners: Female  Other Topics Concern   Not on file  Social History Narrative   Not on file   Social Determinants of Health   Financial Resource Strain: Not on file  Food Insecurity: Not on file  Transportation Needs: Not on file  Physical Activity: Not on file  Stress: Not on file  Social Connections: Not on file  Intimate Partner Violence: Not on file     Current Outpatient Medications:    albuterol (VENTOLIN HFA) 108 (90 Base) MCG/ACT inhaler, Inhale 2 puffs into the lungs every 6 (six) hours as needed for wheezing or shortness of breath., Disp: 8 g, Rfl: 0   benzonatate (TESSALON) 200 MG capsule,  Take 1 capsule (200 mg total) by mouth 2 (two) times daily as needed for cough., Disp: 20 capsule, Rfl: 0   predniSONE (DELTASONE) 50 MG tablet, Take 1 tab po daily x5 days., Disp: 5 tablet, Rfl: 0   ciclopirox (PENLAC) 8 % solution, Apply topically at bedtime. Apply over nail and surrounding skin. Apply daily over previous coat. After seven (7) days, may remove with alcohol and continue cycle., Disp: 6.6 mL, Rfl: 0   meloxicam (MOBIC) 15 MG tablet, Take 1 tablet (15 mg total) by mouth daily as needed for pain., Disp: 30 tablet, Rfl: 0  EXAM:  VITALS per patient if applicable: There were no vitals taken for this visit.  GENERAL: alert, oriented, appears well  and in no acute distress  HEENT: atraumatic, conjunttiva clear, no obvious abnormalities on inspection of external nose and ears  NECK: normal movements of the head and neck  LUNGS: on inspection no signs of respiratory distress, breathing rate appears normal, no obvious gross SOB, gasping or wheezing  CV: no obvious cyanosis  MS: moves all visible extremities without noticeable abnormality  PSYCH/NEURO: pleasant and cooperative, no obvious depression or anxiety, speech and thought processing grossly intact  ASSESSMENT AND PLAN:  Discussed the following assessment and plan:  Cough Course of prednisone with albuterol inhaler as needed.  Tessalon Perles as needed for cough.  We discussed that if not improving in the next couple weeks I would like to see him in the clinic for pulmonary exam and consideration of chest x-ray.     I discussed the assessment and treatment plan with the patient. The patient was provided an opportunity to ask questions and all were answered. The patient agreed with the plan and demonstrated an understanding of the instructions.   The patient was advised to call back or seek an in-person evaluation if the symptoms worsen or if the condition fails to improve as anticipated.    Gene Nutting, DO

## 2022-10-24 ENCOUNTER — Ambulatory Visit: Payer: Self-pay | Admitting: Family Medicine

## 2022-11-13 ENCOUNTER — Encounter: Payer: Self-pay | Admitting: Family Medicine

## 2022-12-05 ENCOUNTER — Ambulatory Visit (INDEPENDENT_AMBULATORY_CARE_PROVIDER_SITE_OTHER): Payer: Self-pay | Admitting: Family Medicine

## 2022-12-05 ENCOUNTER — Encounter: Payer: Self-pay | Admitting: Family Medicine

## 2022-12-05 VITALS — BP 133/74 | HR 63 | Ht 75.0 in | Wt 269.0 lb

## 2022-12-05 DIAGNOSIS — Z0000 Encounter for general adult medical examination without abnormal findings: Secondary | ICD-10-CM | POA: Insufficient documentation

## 2022-12-05 DIAGNOSIS — I10 Essential (primary) hypertension: Secondary | ICD-10-CM

## 2022-12-05 DIAGNOSIS — Z23 Encounter for immunization: Secondary | ICD-10-CM

## 2022-12-05 DIAGNOSIS — Q600 Renal agenesis, unilateral: Secondary | ICD-10-CM

## 2022-12-05 NOTE — Assessment & Plan Note (Signed)
Well adult Orders Placed This Encounter  Procedures   Tdap vaccine greater than or equal to 36yo IM   CBC with Differential   Lipid Panel w/reflex Direct LDL   COMPLETE METABOLIC PANEL WITH GFR   TSH  Screenings: per lab orders Immunizations: Tdap given Anticipatory guidance/Risk factor reduction:  Recommendations per AVS.

## 2022-12-05 NOTE — Progress Notes (Signed)
Gene Nielsen - 37 y.o. male MRN [id number]  Date of birth: 09/16/1985  Subjective Chief Complaint  Patient presents with   Annual Exam    HPI Gene Nielsen is 37 y.o. male here today for annual exam.   Reports that he is doing pretty good.   He continues to stay pretty active. Admits that diet could be better.   He is a non-smoker.  He does use CBD to help with sleep.  He consumes EtOH occasionally.   He is due for updated Tdap, would like to have this today.   Review of Systems  Constitutional:  Negative for chills, fever, malaise/fatigue and weight loss.  HENT:  Negative for congestion, ear pain and sore throat.   Eyes:  Negative for blurred vision, double vision and pain.  Respiratory:  Negative for cough and shortness of breath.   Cardiovascular:  Negative for chest pain and palpitations.  Gastrointestinal:  Negative for abdominal pain, blood in stool, constipation, heartburn and nausea.  Genitourinary:  Negative for dysuria and urgency.  Musculoskeletal:  Negative for joint pain and myalgias.  Neurological:  Negative for dizziness and headaches.  Endo/Heme/Allergies:  Does not bruise/bleed easily.  Psychiatric/Behavioral:  Negative for depression. The patient is not nervous/anxious and does not have insomnia.     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    GERD (gastroesophageal reflux disease) 01/30/2019   Hypertension     Past Surgical History:  Procedure Laterality Date   TONSILLECTOMY AND ADENOIDECTOMY     WISDOM TOOTH EXTRACTION      Social History   Socioeconomic History   Marital status: Married    Spouse name: Not on file   Number of children: Not on file   Years of education: Not on file   Highest education level: Not on file  Occupational History   Not on file  Tobacco Use   Smoking status: Former    Types: Cigarettes     Quit date: 11/18/2011    Years since quitting: 11.0   Smokeless tobacco: Never  Vaping Use   Vaping Use: Never used  Substance and Sexual Activity   Alcohol use: Yes    Alcohol/week: 0.0 standard drinks of alcohol   Drug use: Not Currently   Sexual activity: Yes    Partners: Female  Other Topics Concern   Not on file  Social History Narrative   Not on file   Social Determinants of Health   Financial Resource Strain: Not on file  Food Insecurity: Not on file  Transportation Needs: Not on file  Physical Activity: Not on file  Stress: Not on file  Social Connections: Not on file    Family History  Problem Relation Age of Onset   Depression Mother    Hypertension Mother    Colon cancer Neg Hx    Esophageal cancer Neg Hx     Health Maintenance  Topic Date Due   Hepatitis C Screening  09/08/2023 (Originally 07/28/2005)   DTaP/Tdap/Td (3 - Td or Tdap) 12/04/2032   HIV Screening  Completed   HPV VACCINES  Aged Out   INFLUENZA VACCINE  Discontinued   COVID-19 Vaccine  Discontinued     ----------------------------------------------------------------------------------------------------------------------------------------------------------------------------------------------------------------- Physical Exam BP 133/74 (BP Location: Left Arm, Patient Position: Sitting, Cuff Size: Large)   Pulse 63   Ht 6\' 3"  (1.905 m)  Wt 269 lb (122 kg)   SpO2 99%   BMI 33.62 kg/m   Physical Exam Constitutional:      General: He is not in acute distress. HENT:     Head: Normocephalic and atraumatic.     Right Ear: Tympanic membrane and external ear normal.     Left Ear: Tympanic membrane and external ear normal.  Eyes:     General: No scleral icterus. Neck:     Thyroid: No thyromegaly.  Cardiovascular:     Rate and Rhythm: Normal rate and regular rhythm.     Heart sounds: Normal heart sounds.  Pulmonary:     Effort: Pulmonary effort is normal.     Breath sounds: Normal  breath sounds.  Abdominal:     General: Bowel sounds are normal. There is no distension.     Palpations: Abdomen is soft.     Tenderness: There is no abdominal tenderness. There is no guarding.  Musculoskeletal:     Cervical back: Normal range of motion.  Lymphadenopathy:     Cervical: No cervical adenopathy.  Skin:    General: Skin is warm and dry.     Findings: No rash.  Neurological:     Mental Status: He is alert and oriented to person, place, and time.     Cranial Nerves: No cranial nerve deficit.     Motor: No abnormal muscle tone.  Psychiatric:        Mood and Affect: Mood normal.        Behavior: Behavior normal.     ------------------------------------------------------------------------------------------------------------------------------------------------------------------------------------------------------------------- Assessment and Plan  Well adult exam Well adult Orders Placed This Encounter  Procedures   Tdap vaccine greater than or equal to 7yo IM   CBC with Differential   Lipid Panel w/reflex Direct LDL   COMPLETE METABOLIC PANEL WITH GFR   TSH  Screenings: per lab orders Immunizations: Tdap given Anticipatory guidance/Risk factor reduction:  Recommendations per AVS.    No orders of the defined types were placed in this encounter.   No follow-ups on file.    This visit occurred during the SARS-CoV-2 public health emergency.  Safety protocols were in place, including screening questions prior to the visit, additional usage of staff PPE, and extensive cleaning of exam room while observing appropriate contact time as indicated for disinfecting solutions.

## 2022-12-05 NOTE — Patient Instructions (Signed)
Preventive Care 21-36 Years Old, Male Preventive care refers to lifestyle choices and visits with your health care provider that can promote health and wellness. Preventive care visits are also called wellness exams. What can I expect for my preventive care visit? Counseling During your preventive care visit, your health care provider may ask about your: Medical history, including: Past medical problems. Family medical history. Current health, including: Emotional well-being. Home life and relationship well-being. Sexual activity. Lifestyle, including: Alcohol, nicotine or tobacco, and drug use. Access to firearms. Diet, exercise, and sleep habits. Safety issues such as seatbelt and bike helmet use. Sunscreen use. Work and work environment. Physical exam Your health care provider may check your: Height and weight. These may be used to calculate your BMI (body mass index). BMI is a measurement that tells if you are at a healthy weight. Waist circumference. This measures the distance around your waistline. This measurement also tells if you are at a healthy weight and may help predict your risk of certain diseases, such as type 2 diabetes and high blood pressure. Heart rate and blood pressure. Body temperature. Skin for abnormal spots. What immunizations do I need?  Vaccines are usually given at various ages, according to a schedule. Your health care provider will recommend vaccines for you based on your age, medical history, and lifestyle or other factors, such as travel or where you work. What tests do I need? Screening Your health care provider may recommend screening tests for certain conditions. This may include: Lipid and cholesterol levels. Diabetes screening. This is done by checking your blood sugar (glucose) after you have not eaten for a while (fasting). Hepatitis B test. Hepatitis C test. HIV (human immunodeficiency virus) test. STI (sexually transmitted infection)  testing, if you are at risk. Talk with your health care provider about your test results, treatment options, and if necessary, the need for more tests. Follow these instructions at home: Eating and drinking  Eat a healthy diet that includes fresh fruits and vegetables, whole grains, lean protein, and low-fat dairy products. Drink enough fluid to keep your urine pale yellow. Take vitamin and mineral supplements as recommended by your health care provider. Do not drink alcohol if your health care provider tells you not to drink. If you drink alcohol: Limit how much you have to 0-2 drinks a day. Know how much alcohol is in your drink. In the [place] one drink equals one 12 oz bottle of beer (355 mL), one 5 oz glass of wine (148 mL), or one 1 oz glass of hard liquor (44 mL). Lifestyle Brush your teeth every morning and night with fluoride toothpaste. Floss one time each day. Exercise for at least 30 minutes 5 or more days each week. Do not use any products that contain nicotine or tobacco. These products include cigarettes, chewing tobacco, and vaping devices, such as e-cigarettes. If you need help quitting, ask your health care provider. Do not use drugs. If you are sexually active, practice safe sex. Use a condom or other form of protection to prevent STIs. Find healthy ways to manage stress, such as: Meditation, yoga, or listening to music. Journaling. Talking to a trusted person. Spending time with friends and family. Minimize exposure to UV radiation to reduce your risk of skin cancer. Safety Always wear your seat belt while driving or riding in a vehicle. Do not drive: If you have been drinking alcohol. Do not ride with someone who has been drinking. If you have been using any mind-altering substances   or drugs. While texting. When you are tired or distracted. Wear a helmet and other protective equipment during sports activities. If you have firearms in your house, make sure you  follow all gun safety procedures. Seek help if you have been physically or sexually abused. What's next? Go to your health care provider once a year for an annual wellness visit. Ask your health care provider how often you should have your eyes and teeth checked. Stay up to date on all vaccines. This information is not intended to replace advice given to you by your health care provider. Make sure you discuss any questions you have with your health care provider. Document Revised: 01/19/2021 Document Reviewed: 01/19/2021 Elsevier Patient Education  2023 [employer]  

## 2022-12-06 LAB — CBC WITH DIFFERENTIAL/PLATELET
Absolute Monocytes: 238 cells/uL (ref 200–950)
Basophils Absolute: 51 cells/uL (ref 0–200)
Basophils Relative: 1.5 %
Eosinophils Absolute: 88 cells/uL (ref 15–500)
Eosinophils Relative: 2.6 %
HCT: 45.7 % (ref 38.5–50.0)
Hemoglobin: 15.6 g/dL (ref 13.2–17.1)
Lymphs Abs: 1523 cells/uL (ref 850–3900)
MCH: 30.3 pg (ref 27.0–33.0)
MCHC: 34.1 g/dL (ref 32.0–36.0)
MCV: 88.7 fL (ref 80.0–100.0)
MPV: 11.7 fL (ref 7.5–12.5)
Monocytes Relative: 7 %
Neutro Abs: 1499 cells/uL — ABNORMAL LOW (ref 1500–7800)
Neutrophils Relative %: 44.1 %
Platelets: 247 10*3/uL (ref 140–400)
RBC: 5.15 10*6/uL (ref 4.20–5.80)
RDW: 12.7 % (ref 11.0–15.0)
Total Lymphocyte: 44.8 %
WBC: 3.4 10*3/uL — ABNORMAL LOW (ref 3.8–10.8)

## 2022-12-06 LAB — COMPLETE METABOLIC PANEL WITH GFR
AG Ratio: 2.1 (calc) (ref 1.0–2.5)
ALT: 29 U/L (ref 9–46)
AST: 24 U/L (ref 10–40)
Albumin: 4.9 g/dL (ref 3.6–5.1)
Alkaline phosphatase (APISO): 37 U/L (ref 36–130)
BUN: 16 mg/dL (ref 7–25)
CO2: 26 mmol/L (ref 20–32)
Calcium: 9.9 mg/dL (ref 8.6–10.3)
Chloride: 106 mmol/L (ref 98–110)
Creat: 0.98 mg/dL (ref 0.60–1.26)
Globulin: 2.3 g/dL (calc) (ref 1.9–3.7)
Glucose, Bld: 104 mg/dL — ABNORMAL HIGH (ref 65–99)
Potassium: 5.2 mmol/L (ref 3.5–5.3)
Sodium: 140 mmol/L (ref 135–146)
Total Bilirubin: 0.5 mg/dL (ref 0.2–1.2)
Total Protein: 7.2 g/dL (ref 6.1–8.1)
eGFR: 103 mL/min/{1.73_m2} (ref >=60)

## 2022-12-06 LAB — LIPID PANEL W/REFLEX DIRECT LDL
Cholesterol: 208 mg/dL — ABNORMAL HIGH (ref <200)
HDL: 39 mg/dL — ABNORMAL LOW (ref >=40)
LDL Cholesterol (Calc): 151 mg/dL (calc) — ABNORMAL HIGH
Non-HDL Cholesterol (Calc): 169 mg/dL (calc) — ABNORMAL HIGH (ref <130)
Total CHOL/HDL Ratio: 5.3 (calc) — ABNORMAL HIGH (ref <5.0)
Triglycerides: 75 mg/dL (ref <150)

## 2022-12-06 LAB — TSH: TSH: 1.14 mIU/L (ref 0.40–4.50)

## 2022-12-11 ENCOUNTER — Encounter: Payer: Self-pay | Admitting: Family Medicine

## 2023-07-12 ENCOUNTER — Telehealth (INDEPENDENT_AMBULATORY_CARE_PROVIDER_SITE_OTHER): Payer: Self-pay | Admitting: Family Medicine

## 2023-07-12 DIAGNOSIS — Z91199 Patient's noncompliance with other medical treatment and regimen due to unspecified reason: Secondary | ICD-10-CM

## 2023-07-12 NOTE — Progress Notes (Signed)
Attempted to contact the patient. Name on voicemail did not match patient.

## 2023-07-12 NOTE — Progress Notes (Signed)
No show for virtual visit  

## 2023-10-15 ENCOUNTER — Encounter: Payer: Self-pay | Admitting: Family Medicine

## 2023-10-24 ENCOUNTER — Telehealth: Payer: Self-pay | Admitting: Family Medicine

## 2023-10-24 ENCOUNTER — Encounter: Payer: Self-pay | Admitting: Family Medicine

## 2023-10-24 VITALS — Ht 75.0 in | Wt 270.0 lb

## 2023-10-24 DIAGNOSIS — R7989 Other specified abnormal findings of blood chemistry: Secondary | ICD-10-CM

## 2023-10-24 NOTE — Progress Notes (Signed)
 [name] - 38 y.o. male MRN [id number]  Date of birth: Dec 15, 1985   All issues noted in this document were discussed and addressed.  No physical exam was performed (except for noted visual exam findings with Video Visits).  I discussed the limitations of evaluation and management by telemedicine and the availability of in person appointments. The patient expressed understanding and agreed to proceed.  I connected withNAME@ on 10/24/23 at 10:10 AM EDT by a video enabled telemedicine application and verified that I am speaking with the correct person using two identifiers.  Present at visit: Gene Coombe, DO Jahlil Carranco   Patient Location: Home [address] [place] [address]   Provider location:   [facility]  Chief Complaint  Patient presents with   Hypogonadism    HPI  Gene Nielsen is a 38 y.o. male who presents via audio/video conferencing for a telehealth visit today.  Patient reports that he recently had labs completed at [facility] and was told that he needs testosterone replacement.  Reports that his testosterone was around 350.  FSH and LH were normal.  He does not think SHBG or free testosterone levels were checked.  He denies any symptoms notably related to hypogonadism including fatigue, decreased libido or erectile difficulties.  He wanted to get my opinion on whether he should start testosterone supplementation.   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    GERD (gastroesophageal reflux disease) 01/30/2019   Hypertension     Past Surgical History:  Procedure Laterality Date   TONSILLECTOMY AND ADENOIDECTOMY     WISDOM TOOTH EXTRACTION      Family History  Problem Relation Age of Onset   Depression Mother    Hypertension Mother    Colon cancer Neg Hx    Esophageal cancer Neg Hx     Social History    Socioeconomic History   Marital status: Married    Spouse name: Not on file   Number of children: Not on file   Years of education: Not on file   Highest education level: Not on file  Occupational History   Not on file  Tobacco Use   Smoking status: Former    Current packs/day: 0.00    Types: Cigarettes    Quit date: 11/18/2011    Years since quitting: 11.9   Smokeless tobacco: Never  Vaping Use   Vaping status: Never Used  Substance and Sexual Activity   Alcohol use: Yes    Alcohol/week: 0.0 standard drinks of alcohol   Drug use: Not Currently   Sexual activity: Yes    Partners: Female  Other Topics Concern   Not on file  Social History Narrative   Not on file   Social Drivers of Health   Financial Resource Strain: Not on file  Food Insecurity: Not on file  Transportation Needs: Not on file  Physical Activity: Not on file  Stress: Not on file  Social Connections: Unknown (09/05/2022)   Received from [facility], [facility]   Social Network    Social Network: Not on file  Intimate Partner Violence: Unknown (09/05/2022)   Received from [employer], [facility]   HITS    Physically Hurt: Not on file    Insult or Talk Down To: Not on file    Threaten Physical Harm: Not on  file    Scream or Curse: Not on file    No current outpatient medications on file.  EXAM:  VITALS per patient if applicable: Ht 6\' 3"  (1.905 m)   Wt 270 lb (122.5 kg)   BMI 33.75 kg/m   GENERAL: alert, oriented, appears well and in no acute distress  HEENT: atraumatic, conjunttiva clear, no obvious abnormalities on inspection of external nose and ears  NECK: normal movements of the head and neck  LUNGS: on inspection no signs of respiratory distress, breathing rate appears normal, no obvious gross SOB, gasping or wheezing  CV: no obvious cyanosis  MS: moves all visible extremities without noticeable abnormality  PSYCH/NEURO: pleasant and cooperative, no obvious depression  or anxiety, speech and thought processing grossly intact  ASSESSMENT AND PLAN:  Discussed the following assessment and plan:  Decreased testosterone level His testosterone levels are on the low end of normal, however he has not had any symptoms related to hypogonadism..  We discussed normal ranges for testosterone.  He reports that his labs were checked at 12 PM we discussed that it is best to check an early morning testosterone levels for the most accurate reading.  We discussed risk versus benefits of TRT discussed that TRT can accelerate heart disease as well as accelerate prostate cancer.  We also discussed potential for worsening of sleep apnea and elevation in hemoglobin and hematocrit.  He will prefer to work on dietary changes and weight loss to see if this improves numbers.  Will plan to recheck in 6 to 8 months.     I discussed the assessment and treatment plan with the patient. The patient was provided an opportunity to ask questions and all were answered. The patient agreed with the plan and demonstrated an understanding of the instructions.   The patient was advised to call back or seek an in-person evaluation if the symptoms worsen or if the condition fails to improve as anticipated.    Gene Coombe, DO

## 2023-10-24 NOTE — Progress Notes (Signed)
 Spoke to patient. Interested in starting testosterone therapy.

## 2023-10-24 NOTE — Assessment & Plan Note (Addendum)
 His testosterone levels are on the low end of normal, however he has not had any symptoms related to hypogonadism..  We discussed normal ranges for testosterone.  He reports that his labs were checked at 12 PM we discussed that it is best to check an early morning testosterone levels for the most accurate reading.  We discussed risk versus benefits of TRT discussed that TRT can accelerate heart disease as well as accelerate prostate cancer.  We also discussed potential for worsening of sleep apnea and elevation in hemoglobin and hematocrit.  He will prefer to work on dietary changes and weight loss to see if this improves numbers.  Will plan to recheck in 6 to 8 months.

## 2023-10-31 ENCOUNTER — Telehealth: Payer: Self-pay | Admitting: Family Medicine

## 2024-01-29 ENCOUNTER — Encounter (INDEPENDENT_AMBULATORY_CARE_PROVIDER_SITE_OTHER): Payer: Self-pay | Admitting: Family Medicine

## 2024-01-29 DIAGNOSIS — Q600 Renal agenesis, unilateral: Secondary | ICD-10-CM

## 2024-01-29 DIAGNOSIS — Z13220 Encounter for screening for lipoid disorders: Secondary | ICD-10-CM

## 2024-01-29 DIAGNOSIS — I10 Essential (primary) hypertension: Secondary | ICD-10-CM

## 2024-01-29 DIAGNOSIS — R7989 Other specified abnormal findings of blood chemistry: Secondary | ICD-10-CM

## 2024-01-29 NOTE — Telephone Encounter (Signed)
 Please see the MyChart message reply(ies) for my assessment and plan.    This patient gave consent for this Medical Advice Message and is aware that it may result in a bill to [employer], as well as the possibility of receiving a bill for a co-payment or deductible. They are an established patient, but are not seeking medical advice exclusively about a problem treated during an in person or video visit in the last seven days. I did not recommend an in person or video visit within seven days of my reply.    I spent a total of 6 minutes cumulative time within 7 days through [employer].  Everrett Coombe, DO

## 2024-02-02 LAB — CBC WITH DIFFERENTIAL/PLATELET
Basophils Absolute: 0.1 10*3/uL (ref 0.0–0.2)
Basos: 2 %
EOS (ABSOLUTE): 0.1 10*3/uL (ref 0.0–0.4)
Eos: 3 %
Hematocrit: 48.6 % (ref 37.5–51.0)
Hemoglobin: 16.1 g/dL (ref 13.0–17.7)
Immature Grans (Abs): 0 10*3/uL (ref 0.0–0.1)
Immature Granulocytes: 0 %
Lymphocytes Absolute: 1.9 10*3/uL (ref 0.7–3.1)
Lymphs: 46 %
MCH: 31 pg (ref 26.6–33.0)
MCHC: 33.1 g/dL (ref 31.5–35.7)
MCV: 94 fL (ref 79–97)
Monocytes Absolute: 0.3 10*3/uL (ref 0.1–0.9)
Monocytes: 6 %
Neutrophils Absolute: 1.8 10*3/uL (ref 1.4–7.0)
Neutrophils: 43 %
Platelets: 228 10*3/uL (ref 150–450)
RBC: 5.19 x10E6/uL (ref 4.14–5.80)
RDW: 13.4 % (ref 11.6–15.4)
WBC: 4.1 10*3/uL (ref 3.4–10.8)

## 2024-02-02 LAB — TSH: TSH: 1.83 u[IU]/mL (ref 0.450–4.500)

## 2024-02-02 LAB — LIPID PANEL WITH LDL/HDL RATIO
Cholesterol, Total: 289 mg/dL — ABNORMAL HIGH (ref 100–199)
HDL: 40 mg/dL (ref >39)
LDL Chol Calc (NIH): 238 mg/dL — ABNORMAL HIGH (ref 0–99)
LDL/HDL Ratio: 6 ratio — ABNORMAL HIGH (ref 0.0–3.6)
Triglycerides: 70 mg/dL (ref 0–149)
VLDL Cholesterol Cal: 11 mg/dL (ref 5–40)

## 2024-02-02 LAB — CMP14+EGFR
ALT: 54 IU/L — ABNORMAL HIGH (ref 0–44)
AST: 33 IU/L (ref 0–40)
Albumin: 5.2 g/dL — ABNORMAL HIGH (ref 4.1–5.1)
Alkaline Phosphatase: 60 IU/L (ref 44–121)
BUN/Creatinine Ratio: 19 (ref 9–20)
BUN: 23 mg/dL — ABNORMAL HIGH (ref 6–20)
Bilirubin Total: 0.5 mg/dL (ref 0.0–1.2)
CO2: 20 mmol/L (ref 20–29)
Calcium: 10 mg/dL (ref 8.7–10.2)
Chloride: 104 mmol/L (ref 96–106)
Creatinine, Ser: 1.19 mg/dL (ref 0.76–1.27)
Globulin, Total: 2.4 g/dL (ref 1.5–4.5)
Glucose: 89 mg/dL (ref 70–99)
Potassium: 4.9 mmol/L (ref 3.5–5.2)
Sodium: 139 mmol/L (ref 134–144)
Total Protein: 7.6 g/dL (ref 6.0–8.5)
eGFR: 81 mL/min/{1.73_m2} (ref >59)

## 2024-02-02 LAB — TESTOSTERONE: Testosterone: 415 ng/dL (ref 264–916)

## 2024-02-10 ENCOUNTER — Ambulatory Visit: Payer: Self-pay | Admitting: Family Medicine

## 2024-07-16 ENCOUNTER — Encounter: Payer: Self-pay | Admitting: Family Medicine

## 2024-07-16 DIAGNOSIS — E785 Hyperlipidemia, unspecified: Secondary | ICD-10-CM
